# Patient Record
Sex: Female | Born: 2000 | Race: White | Hispanic: No | Marital: Single
Health system: Southern US, Community
[De-identification: ages and names within clinical notes are randomized; demographics above are authoritative.]

## PROBLEM LIST (undated history)

## (undated) DIAGNOSIS — T7840XA Allergy, unspecified, initial encounter: Secondary | ICD-10-CM

## (undated) HISTORY — DX: Allergy, unspecified, initial encounter: T78.40XA

---
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
## 2022-03-17 ENCOUNTER — Telehealth: Payer: Self-pay

## 2022-03-17 NOTE — Telephone Encounter (Signed)
Copied from [place] [phone]. Topic: General - Other ?>> Mar 16, 2022  4:12 PM McGill, Nelva Bush wrote: ?Reason for CRM: Pt mother called in and stated she wanted to make the office aware that she would be going into the room with pt for her upcoming appointment. Pt wants her mother in the room with her. ? ?Amy pt mother stated she received a call that pt had to go in by herself. ?

## 2022-03-19 ENCOUNTER — Encounter: Payer: Self-pay | Admitting: Family Medicine

## 2022-03-19 ENCOUNTER — Ambulatory Visit: Payer: Managed Care, Other (non HMO) | Admitting: Family Medicine

## 2022-03-19 VITALS — BP 129/86 | HR 66 | Temp 97.5°F | Resp 16 | Ht 64.0 in | Wt 329.3 lb

## 2022-03-19 DIAGNOSIS — L659 Nonscarring hair loss, unspecified: Secondary | ICD-10-CM | POA: Diagnosis not present

## 2022-03-19 DIAGNOSIS — R42 Dizziness and giddiness: Secondary | ICD-10-CM

## 2022-03-19 MED ORDER — SEMAGLUTIDE-WEIGHT MANAGEMENT 0.5 MG/0.5ML ~~LOC~~ SOAJ: 0.5000 mg | SUBCUTANEOUS | 0 refills | Status: DC

## 2022-03-19 NOTE — Assessment & Plan Note (Signed)
Acute concern > 6 months ?Will check TSH and vitamin ?If no clear cause, will defer to derm ?

## 2022-03-19 NOTE — Progress Notes (Signed)
? ?[employer] as a Neurosurgeon for Jacky Kindle, FNP.,have documented all relevant documentation on the behalf of Jacky Kindle, FNP,as directed by  Jacky Kindle, FNP while in the presence of Jacky Kindle, FNP.  ?New patient visit ? ? ?Patient: Jody Alvarado   DOB: 05/04/2001   20 y.o. Female  MRN: [id number] ?Visit Date: 03/19/2022 ? ?Today's healthcare provider: Jacky Kindle, FNP  ?Introduced to Publishing rights manager role and practice setting.  All questions answered.  Discussed provider/patient relationship and expectations. ? ? ?Chief Complaint  ?Patient presents with  ? New Patient (Initial Visit)  ? ?Subjective  ?  ?Jody Alvarado is a 21 y.o. female who presents today as a new patient to establish care.  ?HPI  ?Patient presents in office today accompanied by her mother as a new patient. Patient stats that she feels well today, she is a former patient of [employer]. Patient states that she would like to address today hair thinning over the past 6 months, weight management and she reports episodes of dizziness that has been intermittent for 90 days or less. Dizziness is triggered upon standing suddenly, patient denies associated symptoms of nausea, vomiting or visual changes . ? ?Past Medical History:  ?Diagnosis Date  ? Allergy   ? ?History reviewed. No pertinent surgical history. ?Family Status  ?Relation Name Status  ? Mother  (Not Specified)  ? Mat Uncle  (Not Specified)  ? MGM  (Not Specified)  ? ?Family History  ?Problem Relation Age of Onset  ? Depression Mother   ? Anxiety disorder Mother   ? Anxiety disorder Maternal Uncle   ? Thyroid disease Maternal Grandmother   ? ?Social History  ? ?Socioeconomic History  ? Marital status: Single  ?  Spouse name: Not on file  ? Number of children: Not on file  ? Years of education: Not on file  ? Highest education level: Not on file  ?Occupational History  ? Not on file  ?Tobacco Use  ? Smoking status: Not on file  ? Smokeless tobacco: Not on file   ?Substance and Sexual Activity  ? Alcohol use: Not on file  ? Drug use: Not on file  ? Sexual activity: Not on file  ?Other Topics Concern  ? Not on file  ?Social History Narrative  ? Not on file  ? ?Social Determinants of Health  ? ?Financial Resource Strain: Not on file  ?Food Insecurity: Not on file  ?Transportation Needs: Not on file  ?Physical Activity: Not on file  ?Stress: Not on file  ?Social Connections: Not on file  ? ?No outpatient medications prior to visit.  ? ?No facility-administered medications prior to visit.  ? ?Allergies  ?Allergen Reactions  ? Amoxicillin-Pot Clavulanate Rash  ? ? ? ?There is no immunization history on file for this patient. ? ?Health Maintenance  ?Topic Date Due  ? COVID-19 Vaccine (1) Never done  ? HPV VACCINES (1 - 2-dose series) Never done  ? HIV Screening  Never done  ? Hepatitis C Screening  Never done  ? INFLUENZA VACCINE  07/13/2022  ? ? ?Patient Care Team: ?Jacky Kindle, FNP as PCP - General (Family Medicine) ? ?Review of Systems  ?Constitutional:  Positive for unexpected weight change.  ?Skin:  Positive for rash.  ?Neurological:  Positive for dizziness and headaches.  ?Psychiatric/Behavioral:  The patient is nervous/anxious.   ?All other systems reviewed and are negative. ? ? ? ? Objective  ?  ?  BP 129/86   Pulse 66   Temp (!) 97.5 ?F (36.4 ?C) (Temporal)   Resp 16   Ht 5\' 4"  (1.626 m)   Wt (!) 329 lb 4.8 oz (149.4 kg)   LMP 03/15/2022 (Exact Date)   BMI 56.52 kg/m?  ? ? ?Physical Exam ?Vitals and nursing note reviewed.  ?Constitutional:   ?   General: She is not in acute distress. ?   Appearance: Normal appearance. She is obese. She is not ill-appearing, toxic-appearing or diaphoretic.  ?HENT:  ?   Head: Normocephalic and atraumatic.  ?Cardiovascular:  ?   Rate and Rhythm: Normal rate and regular rhythm.  ?   Pulses: Normal pulses.  ?   Heart sounds: Normal heart sounds. No murmur heard. ?  No friction rub. No gallop.  ?Pulmonary:  ?   Effort: Pulmonary effort  is normal. No respiratory distress.  ?   Breath sounds: Normal breath sounds. No stridor. No wheezing, rhonchi or rales.  ?Chest:  ?   Chest wall: No tenderness.  ?Abdominal:  ?   General: Bowel sounds are normal.  ?   Palpations: Abdomen is soft.  ?Musculoskeletal:     ?   General: No swelling, tenderness, deformity or signs of injury. Normal range of motion.  ?   Cervical back: Normal range of motion and neck supple. No tenderness.  ?   Right lower leg: No edema.  ?   Left lower leg: No edema.  ?Lymphadenopathy:  ?   Cervical: No cervical adenopathy.  ?Skin: ?   General: Skin is warm and dry.  ?   Capillary Refill: Capillary refill takes less than 2 seconds.  ?   Coloration: Skin is not jaundiced or pale.  ?   Findings: No bruising, erythema, lesion or rash.  ?Neurological:  ?   General: No focal deficit present.  ?   Mental Status: She is alert and oriented to person, place, and time. Mental status is at baseline.  ?   Cranial Nerves: No cranial nerve deficit.  ?   Sensory: No sensory deficit.  ?   Motor: No weakness.  ?   Coordination: Coordination normal.  ?Psychiatric:     ?   Mood and Affect: Mood normal.     ?   Behavior: Behavior normal.     ?   Thought Content: Thought content normal.     ?   Judgment: Judgment normal.  ? ? ?Depression Screen ? ?  03/19/2022  ?  1:29 PM  ?PHQ 2/9 Scores  ?PHQ - 2 Score 0  ?PHQ- 9 Score 3  ? ?No results found for any visits on 03/19/22. ? Assessment & Plan   ?  ? ?Problem List Items Addressed This Visit   ? ?  ? Other  ? Dizziness - Primary  ?  EKG done for dizziness ?No previous EKG ?Read of SR; no ectopy  ?No sign of LVH ?Will get CBC as well ?Normal heart sounds ? ?  ?  ? Relevant Orders  ? EKG 12-Lead (Completed)  ? CBC with Differential/Platelet  ? Hair loss  ?  Acute concern > 6 months ?Will check TSH and vitamin ?If no clear cause, will defer to derm ?  ?  ? Relevant Orders  ? Vitamin D (25 hydroxy)  ? B12 and Folate Panel  ? TSH + free T4  ? Morbid obesity (HCC)  ?   Chronic concern, worsening ?Gaining 70# in 2 years ?Will check CMP  and A1c today ?Will start wegovy; advised may need PA ?  ?  ? Relevant Medications  ? Semaglutide-Weight Management 0.5 MG/0.5ML SOAJ  ? Other Relevant Orders  ? Comprehensive Metabolic Panel (CMET)  ? Lipid panel  ? Hemoglobin A1c  ? ? ? ?Return in about 6 months (around 09/18/2022) for chonic disease management.  ?  ? ?I, Jacky Kindle, FNP, have reviewed all documentation for this visit. The documentation on 03/19/22 for the exam, diagnosis, procedures, and orders are all accurate and complete. ? ?Jacky Kindle, FNP  ?[facility] ?[phone] (phone) ?[phone] (fax) ? ?[facility] ?

## 2022-03-19 NOTE — Assessment & Plan Note (Signed)
EKG done for dizziness ?No previous EKG ?Read of SR; no ectopy  ?No sign of LVH ?Will get CBC as well ?Normal heart sounds ? ?

## 2022-03-19 NOTE — Assessment & Plan Note (Signed)
Chronic concern, worsening ?Gaining 70# in 2 years ?Will check CMP and A1c today ?Will start wegovy; advised may need PA ?

## 2022-03-20 LAB — CBC WITH DIFFERENTIAL/PLATELET
Basophils Absolute: 0 10*3/uL (ref 0.0–0.2)
Basos: 0 %
EOS (ABSOLUTE): 0 10*3/uL (ref 0.0–0.4)
Eos: 0 %
Hematocrit: 36.3 % (ref 34.0–46.6)
Hemoglobin: 12 g/dL (ref 11.1–15.9)
Immature Grans (Abs): 0 10*3/uL (ref 0.0–0.1)
Immature Granulocytes: 0 %
Lymphocytes Absolute: 2 10*3/uL (ref 0.7–3.1)
Lymphs: 29 %
MCH: 27 pg (ref 26.6–33.0)
MCHC: 33.1 g/dL (ref 31.5–35.7)
MCV: 82 fL (ref 79–97)
Monocytes Absolute: 0.3 10*3/uL (ref 0.1–0.9)
Monocytes: 5 %
Neutrophils Absolute: 4.4 10*3/uL (ref 1.4–7.0)
Neutrophils: 66 %
Platelets: 282 10*3/uL (ref 150–450)
RBC: 4.45 x10E6/uL (ref 3.77–5.28)
RDW: 13.9 % (ref 11.7–15.4)
WBC: 6.7 10*3/uL (ref 3.4–10.8)

## 2022-03-20 LAB — COMPREHENSIVE METABOLIC PANEL
ALT: 15 IU/L (ref 0–32)
AST: 16 IU/L (ref 0–40)
Albumin/Globulin Ratio: 1.4 (ref 1.2–2.2)
Albumin: 4 g/dL (ref 3.9–5.0)
Alkaline Phosphatase: 138 IU/L — ABNORMAL HIGH (ref 42–106)
BUN/Creatinine Ratio: 18 (ref 9–23)
BUN: 11 mg/dL (ref 6–20)
Bilirubin Total: 0.3 mg/dL (ref 0.0–1.2)
CO2: 23 mmol/L (ref 20–29)
Calcium: 9.2 mg/dL (ref 8.7–10.2)
Chloride: 103 mmol/L (ref 96–106)
Creatinine, Ser: 0.61 mg/dL (ref 0.57–1.00)
Globulin, Total: 2.9 g/dL (ref 1.5–4.5)
Glucose: 95 mg/dL (ref 70–99)
Potassium: 4.3 mmol/L (ref 3.5–5.2)
Sodium: 140 mmol/L (ref 134–144)
Total Protein: 6.9 g/dL (ref 6.0–8.5)
eGFR: 133 mL/min/{1.73_m2} (ref >59)

## 2022-03-20 LAB — HEMOGLOBIN A1C
Est. average glucose Bld gHb Est-mCnc: 91 mg/dL
Hgb A1c MFr Bld: 4.8 % (ref 4.8–5.6)

## 2022-03-20 LAB — LIPID PANEL
Chol/HDL Ratio: 3.5 ratio (ref 0.0–4.4)
Cholesterol, Total: 180 mg/dL — ABNORMAL HIGH (ref 100–169)
HDL: 51 mg/dL (ref >39)
LDL Chol Calc (NIH): 114 mg/dL — ABNORMAL HIGH (ref 0–109)
Triglycerides: 80 mg/dL (ref 0–89)
VLDL Cholesterol Cal: 15 mg/dL (ref 5–40)

## 2022-03-20 LAB — TSH+FREE T4
Free T4: 0.97 ng/dL (ref 0.93–1.60)
TSH: 3.66 u[IU]/mL (ref 0.450–4.500)

## 2022-03-20 LAB — B12 AND FOLATE PANEL
Folate: 7.8 ng/mL (ref >3.0)
Vitamin B-12: 536 pg/mL (ref 232–1245)

## 2022-03-20 LAB — VITAMIN D 25 HYDROXY (VIT D DEFICIENCY, FRACTURES): Vit D, 25-Hydroxy: 20.2 ng/mL — ABNORMAL LOW (ref 30.0–100.0)

## 2022-03-21 ENCOUNTER — Other Ambulatory Visit: Payer: Self-pay | Admitting: Family Medicine

## 2022-03-21 MED ORDER — VITAMIN D (ERGOCALCIFEROL) 1.25 MG (50000 UNIT) PO CAPS: 50000.0000 [IU] | ORAL_CAPSULE | ORAL | 1 refills | Status: DC

## 2022-03-25 ENCOUNTER — Telehealth: Payer: Self-pay | Admitting: Family Medicine

## 2022-03-25 NOTE — Telephone Encounter (Signed)
Pre-authorization in progress. KW 

## 2022-03-25 NOTE — Telephone Encounter (Signed)
Pt following up on need for a prior auth on her ?Semaglutide-Weight Management 0.5 MG/0.5ML SOAJ ? ?[phone] ? ?Pt states her insurance told her to mark it as urgent. ?

## 2022-04-06 NOTE — Telephone Encounter (Signed)
Patient mom Jody Alvarado called in to inquire what is the next step with medication since Wagovy was not approved please call Jody Alvarado at Ph# [phone] ?

## 2022-04-08 NOTE — Telephone Encounter (Signed)
Patient's mother reports medication has been approved.  ?

## 2022-04-21 ENCOUNTER — Other Ambulatory Visit: Payer: Self-pay | Admitting: Family Medicine

## 2022-04-22 ENCOUNTER — Encounter: Payer: Self-pay | Admitting: Family Medicine

## 2022-04-26 ENCOUNTER — Other Ambulatory Visit: Payer: Self-pay | Admitting: Family Medicine

## 2022-04-28 ENCOUNTER — Other Ambulatory Visit: Payer: Self-pay | Admitting: Family Medicine

## 2022-04-28 MED ORDER — ONDANSETRON HCL 4 MG PO TABS: 4.0000 mg | ORAL_TABLET | Freq: Three times a day (TID) | ORAL | 3 refills | Status: DC | PRN

## 2022-04-29 ENCOUNTER — Encounter: Payer: Self-pay | Admitting: Obstetrics

## 2022-05-24 ENCOUNTER — Other Ambulatory Visit: Payer: Self-pay | Admitting: Family Medicine

## 2022-05-24 MED ORDER — SEMAGLUTIDE-WEIGHT MANAGEMENT 1.7 MG/0.75ML ~~LOC~~ SOAJ: 1.7000 mg | SUBCUTANEOUS | 0 refills | Status: DC

## 2022-05-24 MED ORDER — SEMAGLUTIDE-WEIGHT MANAGEMENT 2.4 MG/0.75ML ~~LOC~~ SOAJ: 2.4000 mg | SUBCUTANEOUS | 3 refills | Status: DC

## 2022-05-24 MED ORDER — SEMAGLUTIDE-WEIGHT MANAGEMENT 1 MG/0.5ML ~~LOC~~ SOAJ: 1.0000 mg | SUBCUTANEOUS | 0 refills | Status: DC

## 2022-06-21 ENCOUNTER — Encounter: Payer: Self-pay | Admitting: Family Medicine

## 2022-06-21 DIAGNOSIS — Z111 Encounter for screening for respiratory tuberculosis: Secondary | ICD-10-CM

## 2022-06-26 LAB — QUANTIFERON-TB GOLD PLUS
QuantiFERON Mitogen Value: >10 IU/mL
QuantiFERON Nil Value: 0 IU/mL
QuantiFERON TB1 Ag Value: 0.02 IU/mL
QuantiFERON TB2 Ag Value: 0.01 IU/mL
QuantiFERON-TB Gold Plus: NEGATIVE

## 2022-06-28 ENCOUNTER — Telehealth: Payer: Self-pay | Admitting: Family Medicine

## 2022-06-28 NOTE — Telephone Encounter (Signed)
Called patient to let them know forms are ready for pickup at the front desk. Okay for [facility] to advise.

## 2022-06-28 NOTE — Telephone Encounter (Signed)
Pt is calling is calling to see if her forms are ready. Please advise

## 2022-06-28 NOTE — Telephone Encounter (Signed)
Pt expressed understanding

## 2022-06-29 NOTE — Progress Notes (Signed)
Negative TB; form should be complete and ready for pick up later this week.   Jacky Kindle, FNP  [facility] [address] [place], [place] [address] [phone] (phone) [phone] (fax) [facility]

## 2022-09-18 ENCOUNTER — Other Ambulatory Visit: Payer: Self-pay | Admitting: Family Medicine

## 2022-11-25 ENCOUNTER — Ambulatory Visit (INDEPENDENT_AMBULATORY_CARE_PROVIDER_SITE_OTHER): Payer: BC Managed Care – PPO | Admitting: Family Medicine

## 2022-11-25 ENCOUNTER — Encounter: Payer: Self-pay | Admitting: Family Medicine

## 2022-11-25 VITALS — BP 125/72 | HR 71 | Resp 16 | Ht 64.0 in | Wt 311.0 lb

## 2022-11-25 DIAGNOSIS — R300 Dysuria: Secondary | ICD-10-CM

## 2022-11-25 LAB — POCT URINALYSIS DIPSTICK
Bilirubin, UA: NEGATIVE
Blood, UA: NEGATIVE
Glucose, UA: NEGATIVE
Ketones, UA: NEGATIVE
Leukocytes, UA: NEGATIVE
Nitrite, UA: NEGATIVE
Protein, UA: NEGATIVE
Spec Grav, UA: <=1.005 — AB (ref 1.010–1.025)
Urobilinogen, UA: 0.2 E.U./dL
pH, UA: 6 (ref 5.0–8.0)

## 2022-11-25 NOTE — Assessment & Plan Note (Signed)
2 days of symptoms No OTC products tried UA clean; will send for Ucx Pt denies concern for STIs No CVA or bladder tenderness RTC PRN

## 2022-11-25 NOTE — Patient Instructions (Signed)
AZO over the counter to assist Culture will return in 3 days- will call in Antibiotic as needed

## 2022-11-25 NOTE — Assessment & Plan Note (Signed)
Chronic, stable Denies further assistance at this time Body mass index is 53.38 kg/m.

## 2022-11-25 NOTE — Progress Notes (Signed)
Established patient visit   Patient: Jody Alvarado   DOB: 2001/04/14   21 y.o. Female  MRN: [id number] Visit Date: 11/25/2022  Today's healthcare provider: Jacky Kindle, FNP  Re Introduced to nurse practitioner role and practice setting.  All questions answered.  Discussed provider/patient relationship and expectations.  I,Tiffany J Bragg,acting as a scribe for Jacky Kindle, FNP.,have documented all relevant documentation on the behalf of Jacky Kindle, FNP,as directed by  Jacky Kindle, FNP while in the presence of Jacky Kindle, FNP.   Chief Complaint  Patient presents with   Dysuria    Patient complains of burning with urination for 2 days.    Subjective    HPI HPI     Dysuria    Additional comments: Patient complains of burning with urination for 2 days.       Last edited by Marlana Salvage, CMA on 11/25/2022  3:44 PM.      Medications: Outpatient Medications Prior to Visit  Medication Sig   ondansetron (ZOFRAN) 4 MG tablet Take 1 tablet (4 mg total) by mouth every 8 (eight) hours as needed for nausea or vomiting.   [DISCONTINUED] Semaglutide-Weight Management 1 MG/0.5ML SOAJ Inject 1 mg into the skin once a week.   [DISCONTINUED] Semaglutide-Weight Management 1.7 MG/0.75ML SOAJ Inject 1.7 mg into the skin once a week.   [DISCONTINUED] Semaglutide-Weight Management 2.4 MG/0.75ML SOAJ Inject 2.4 mg into the skin once a week.   [DISCONTINUED] Vitamin D, Ergocalciferol, (DRISDOL) 1.25 MG (50000 UNIT) CAPS capsule Take 1 capsule (50,000 Units total) by mouth every 7 (seven) days.   No facility-administered medications prior to visit.    Review of Systems    Objective    BP 125/72 (BP Location: Right Arm, Patient Position: Sitting, Cuff Size: Large)   Pulse 71   Resp 16   Ht 5\' 4"  (1.626 m)   Wt (!) 311 lb (141.1 kg)   SpO2 100%   BMI 53.38 kg/m   Physical Exam Vitals and nursing note reviewed.  Constitutional:      General: She is not in acute  distress.    Appearance: Normal appearance. She is obese. She is not ill-appearing, toxic-appearing or diaphoretic.  HENT:     Head: Normocephalic and atraumatic.  Cardiovascular:     Rate and Rhythm: Normal rate and regular rhythm.     Pulses: Normal pulses.     Heart sounds: Normal heart sounds. No murmur heard.    No friction rub. No gallop.  Pulmonary:     Effort: Pulmonary effort is normal. No respiratory distress.     Breath sounds: Normal breath sounds. No stridor. No wheezing, rhonchi or rales.  Chest:     Chest wall: No tenderness.  Abdominal:     Palpations: Abdomen is soft.     Tenderness: There is no abdominal tenderness. There is no right CVA tenderness or left CVA tenderness.  Musculoskeletal:        General: No swelling, tenderness, deformity or signs of injury. Normal range of motion.     Right lower leg: No edema.     Left lower leg: No edema.  Skin:    General: Skin is warm and dry.     Capillary Refill: Capillary refill takes less than 2 seconds.     Coloration: Skin is not jaundiced or pale.     Findings: No bruising, erythema, lesion or rash.  Neurological:     General: No  focal deficit present.     Mental Status: She is alert and oriented to person, place, and time. Mental status is at baseline.     Cranial Nerves: No cranial nerve deficit.     Sensory: No sensory deficit.     Motor: No weakness.     Coordination: Coordination normal.  Psychiatric:        Mood and Affect: Mood normal.        Behavior: Behavior normal.        Thought Content: Thought content normal.        Judgment: Judgment normal.     Results for orders placed or performed in visit on 11/25/22  POCT Urinalysis Dipstick  Result Value Ref Range   Color, UA yellow    Clarity, UA clear    Glucose, UA Negative Negative   Bilirubin, UA negative    Ketones, UA negative    Spec Grav, UA <=1.005 (A) 1.010 - 1.025   Blood, UA negative    pH, UA 6.0 5.0 - 8.0   Protein, UA Negative  Negative   Urobilinogen, UA 0.2 0.2 or 1.0 E.U./dL   Nitrite, UA negative    Leukocytes, UA Negative Negative   Appearance     Odor      Assessment & Plan     Problem List Items Addressed This Visit       Other   Dysuria - Primary    2 days of symptoms No OTC products tried UA clean; will send for Ucx Pt denies concern for STIs No CVA or bladder tenderness RTC PRN      Relevant Orders   POCT Urinalysis Dipstick (Completed)   Urine Culture   Morbid obesity (HCC)    Chronic, stable Denies further assistance at this time Body mass index is 53.38 kg/m.       Return if symptoms worsen or fail to improve.     Leilani Merl, FNP, have reviewed all documentation for this visit. The documentation on 11/25/22 for the exam, diagnosis, procedures, and orders are all accurate and complete.  Jacky Kindle, FNP  [facility] [phone] (phone) [phone] (fax)  [facility]

## 2022-11-29 LAB — URINE CULTURE

## 2022-11-30 NOTE — Progress Notes (Signed)
Negative UTI- no single bacteria identified on urine culture

## 2022-12-10 DIAGNOSIS — H9202 Otalgia, left ear: Secondary | ICD-10-CM | POA: Diagnosis not present

## 2022-12-10 DIAGNOSIS — A499 Bacterial infection, unspecified: Secondary | ICD-10-CM | POA: Diagnosis not present

## 2022-12-14 ENCOUNTER — Encounter: Payer: Self-pay | Admitting: Family Medicine

## 2022-12-14 ENCOUNTER — Ambulatory Visit (INDEPENDENT_AMBULATORY_CARE_PROVIDER_SITE_OTHER): Payer: BC Managed Care – PPO | Admitting: Family Medicine

## 2022-12-14 VITALS — BP 147/65 | HR 76 | Temp 98.1°F | Wt 306.9 lb

## 2022-12-14 DIAGNOSIS — H66002 Acute suppurative otitis media without spontaneous rupture of ear drum, left ear: Secondary | ICD-10-CM | POA: Insufficient documentation

## 2022-12-14 DIAGNOSIS — I10 Essential (primary) hypertension: Secondary | ICD-10-CM | POA: Insufficient documentation

## 2022-12-14 DIAGNOSIS — G43011 Migraine without aura, intractable, with status migrainosus: Secondary | ICD-10-CM | POA: Diagnosis not present

## 2022-12-14 MED ORDER — FLUTICASONE PROPIONATE 50 MCG/ACT NA SUSP: 2.0000 | Freq: Every day | NASAL | 6 refills | Status: DC

## 2022-12-14 MED ORDER — CEFDINIR 300 MG PO CAPS: 300.0000 mg | ORAL_CAPSULE | Freq: Two times a day (BID) | ORAL | 0 refills | Status: DC

## 2022-12-14 MED ORDER — NURTEC 75 MG PO TBDP: 1.0000 | ORAL_TABLET | ORAL | 0 refills | Status: DC | PRN

## 2022-12-14 NOTE — Progress Notes (Signed)
I,Connie R Striblin,acting as a Education administrator for Gwyneth Sprout, FNP.,have documented all relevant documentation on the behalf of Gwyneth Sprout, FNP,as directed by  Gwyneth Sprout, FNP while in the presence of Gwyneth Sprout, FNP.  Established patient visit  Patient: Jody Alvarado   DOB: 09-16-2001   22 y.o. Female  MRN: [id number] Visit Date: 12/14/2022  Today's healthcare provider: Gwyneth Sprout, FNP  Re Introduced to nurse practitioner role and practice setting.  All questions answered.  Discussed provider/patient relationship and expectations.  Subjective    HPI  Headache: Pt complains of neck pain, ear pain and headaches  Patient states that she has sore throat,ear pain, congestion and cough  2 weeks ago  Virtual appointment 12/29 was prescribed z pack which improved her condition    Headaches started Wednesday they are  inconsistent with light sensitivity   Pt denies dizziness   OTC pain meds not helping (advil)  Pt complains of nausea and diahrea which started today   Medications: Outpatient Medications Prior to Visit  Medication Sig   ondansetron (ZOFRAN) 4 MG tablet Take 1 tablet (4 mg total) by mouth every 8 (eight) hours as needed for nausea or vomiting. (Patient not taking: Reported on 12/14/2022)   No facility-administered medications prior to visit.   Review of Systems    Objective    BP (!) 147/65 (BP Location: Left Arm, Patient Position: Sitting, Cuff Size: Large)   Pulse 76   Temp 98.1 F (36.7 C) (Oral)   Wt (!) 306 lb 14.4 oz (139.2 kg)   SpO2 100%   BMI 52.68 kg/m   Physical Exam Vitals and nursing note reviewed.  Constitutional:      General: She is not in acute distress.    Appearance: Normal appearance. She is well-developed. She is obese. She is not ill-appearing, toxic-appearing or diaphoretic.  HENT:     Head: Normocephalic and atraumatic.     Right Ear: Tympanic membrane, ear canal and external ear normal.     Left Ear: External ear normal.  Swelling and tenderness present. Tympanic membrane is erythematous.     Nose: Nose normal.     Mouth/Throat:     Mouth: Mucous membranes are moist.     Pharynx: Oropharynx is clear.  Eyes:     Extraocular Movements: Extraocular movements intact.  Neck:     Comments: Lymphadenopathy behind L ear in setting of acute infection Cardiovascular:     Rate and Rhythm: Normal rate and regular rhythm.     Pulses: Normal pulses.     Heart sounds: Normal heart sounds. No murmur heard.    No friction rub. No gallop.  Pulmonary:     Effort: Pulmonary effort is normal. No respiratory distress.     Breath sounds: Normal breath sounds. No stridor. No wheezing, rhonchi or rales.  Chest:     Chest wall: No tenderness.  Abdominal:     General: Bowel sounds are normal.     Palpations: Abdomen is soft.     Comments: Continue to recommend bland meals and adequate hydration to assist   Musculoskeletal:        General: No swelling, tenderness, deformity or signs of injury. Normal range of motion.     Right lower leg: No edema.     Left lower leg: No edema.  Skin:    General: Skin is warm and dry.     Capillary Refill: Capillary refill takes less than 2 seconds.  Coloration: Skin is not jaundiced or pale.     Findings: No bruising, erythema, lesion or rash.  Neurological:     General: No focal deficit present.     Mental Status: She is alert and oriented to person, place, and time. Mental status is at baseline.     Cranial Nerves: No cranial nerve deficit.     Sensory: No sensory deficit.     Motor: No weakness.     Coordination: Coordination normal.  Psychiatric:        Mood and Affect: Mood normal.        Speech: Speech normal.        Behavior: Behavior normal.        Thought Content: Thought content normal.        Judgment: Judgment normal.     No results found for any visits on 12/14/22.  Assessment & Plan     Problem List Items Addressed This Visit       Cardiovascular and  Mediastinum   Elevated blood pressure reading in office with diagnosis of hypertension    Likely in setting of acute illness and poor PO intake Continue to monitor  Goal <140/<90      Intractable migraine without aura and with status migrainosus    Acute, in setting of recent illness Trial of nurtec to assist Continue to recommend hydration, sleep, caffeine and stress monitoring Return as needed      Relevant Medications   Rimegepant Sulfate (NURTEC) 75 MG TBDP     Nervous and Auditory   Non-recurrent acute suppurative otitis media of left ear without spontaneous rupture of tympanic membrane - Primary    Acute, stable Was treated with Zpak for URI; however, pain has settled into L ear and has not improved Recommend supportive care with new Abx today RTC as needed Work note provided online      Relevant Medications   cefdinir (OMNICEF) 300 MG capsule   fluticasone (FLONASE) 50 MCG/ACT nasal spray     Other   Morbid obesity ([place])    Chronic, stable Failed wegovy previously in setting of gallbladder concerns Body mass index is 52.68 kg/m.       Return if symptoms worsen or fail to improve.     Vonna Kotyk, FNP, have reviewed all documentation for this visit. The documentation on 12/14/22 for the exam, diagnosis, procedures, and orders are all accurate and complete.  Gwyneth Sprout, [place] [phone] (phone) [phone] (fax)  [place]

## 2022-12-14 NOTE — Assessment & Plan Note (Signed)
Acute, in setting of recent illness Trial of nurtec to assist Continue to recommend hydration, sleep, caffeine and stress monitoring Return as needed

## 2022-12-14 NOTE — Assessment & Plan Note (Signed)
Acute, stable Was treated with Zpak for URI; however, pain has settled into L ear and has not improved Recommend supportive care with new Abx today RTC as needed Work note provided online

## 2022-12-14 NOTE — Assessment & Plan Note (Signed)
Likely in setting of acute illness and poor PO intake Continue to monitor  Goal <140/<90

## 2022-12-14 NOTE — Assessment & Plan Note (Signed)
Chronic, stable Failed wegovy previously in setting of gallbladder concerns Body mass index is 52.68 kg/m.

## 2023-05-16 ENCOUNTER — Telehealth: Payer: Self-pay

## 2023-05-16 NOTE — Telephone Encounter (Signed)
LVM. Also sending mychart msg. AS, CMA

## 2023-06-03 ENCOUNTER — Ambulatory Visit (INDEPENDENT_AMBULATORY_CARE_PROVIDER_SITE_OTHER): Payer: BC Managed Care – PPO | Admitting: Family Medicine

## 2023-06-03 ENCOUNTER — Encounter: Payer: Self-pay | Admitting: Family Medicine

## 2023-06-03 VITALS — BP 127/72 | HR 74 | Ht 64.0 in | Wt 324.8 lb

## 2023-06-03 DIAGNOSIS — Z0000 Encounter for general adult medical examination without abnormal findings: Secondary | ICD-10-CM

## 2023-06-03 NOTE — Assessment & Plan Note (Signed)
Chronic, worsening Body mass index is 55.75 kg/m. Discussed importance of healthy weight management Discussed diet and exercise

## 2023-06-03 NOTE — Progress Notes (Signed)
I,Sha'taria Tyson,acting as a Neurosurgeon for Jacky Kindle, FNP.,have documented all relevant documentation on the behalf of Jacky Kindle, FNP,as directed by  Jacky Kindle, FNP while in the presence of Jacky Kindle, FNP.   Complete physical exam   Patient: Jody Alvarado   DOB: [birth year]   22 y.o. Female  MRN: [id number] Visit Date: 06/03/2023  Today's healthcare provider: Jacky Kindle, FNP  Introduced to nurse practitioner role and practice setting.  All questions answered.  Discussed provider/patient relationship and expectations.  Pt declines vaccines/labs at this time; will reach back out to PCP as needed.  Subjective    Jody Alvarado is a 22 y.o. female who presents today for a complete physical exam.  She reports consuming a general diet. The patient does not participate in regular exercise at present. She generally feels well. She reports sleeping well. She does not have additional problems to discuss today.   HPI  -HPV Vaccine: Declined -HIV Screen and Hepatitis C Screening: Declined  Past Medical History:  Diagnosis Date   Allergy    History reviewed. No pertinent surgical history. Social History   Socioeconomic History   Marital status: Single    Spouse name: Not on file   Number of children: Not on file   Years of education: Not on file   Highest education level: Not on file  Occupational History   Not on file  Tobacco Use   Smoking status: Unknown   Smokeless tobacco: Not on file  Vaping Use   Vaping Use: Not on file  Substance and Sexual Activity   Alcohol use: Not Currently   Drug use: Not Currently   Sexual activity: Not Currently  Other Topics Concern   Not on file  Social History Narrative   Not on file   Social Determinants of Health   Financial Resource Strain: Not on file  Food Insecurity: Not on file  Transportation Needs: Not on file  Physical Activity: Not on file  Stress: Not on file  Social Connections: Not on file  Intimate  Partner Violence: Not on file   Family Status  Relation Name Status   Mother  (Not Specified)   Mat Uncle  (Not Specified)   MGM  (Not Specified)   Family History  Problem Relation Age of Onset   Depression Mother    Anxiety disorder Mother    Anxiety disorder Maternal Uncle    Thyroid disease Maternal Grandmother    Allergies  Allergen Reactions   Amoxicillin-Pot Clavulanate Rash    Patient Care Team: Jacky Kindle, FNP as PCP - General (Family Medicine)   Medications: Outpatient Medications Prior to Visit  Medication Sig   [DISCONTINUED] cefdinir (OMNICEF) 300 MG capsule Take 1 capsule (300 mg total) by mouth 2 (two) times daily. (Patient not taking: Reported on 06/03/2023)   [DISCONTINUED] fluticasone (FLONASE) 50 MCG/ACT nasal spray Place 2 sprays into both nostrils daily. (Patient not taking: Reported on 06/03/2023)   [DISCONTINUED] ondansetron (ZOFRAN) 4 MG tablet Take 1 tablet (4 mg total) by mouth every 8 (eight) hours as needed for nausea or vomiting. (Patient not taking: Reported on 12/14/2022)   [DISCONTINUED] Rimegepant Sulfate (NURTEC) 75 MG TBDP Take 1 tablet by mouth as needed. (Patient not taking: Reported on 06/03/2023)   No facility-administered medications prior to visit.    Review of Systems    Objective    BP 127/72 (BP Location: Right Arm, Patient Position: Sitting, Cuff Size: Large)  Pulse 74   Ht 5\' 4"  (1.626 m)   Wt (!) 324 lb 12.8 oz (147.3 kg)   LMP 05/16/2023 (Within Days)   SpO2 99%   BMI 55.75 kg/m     Physical Exam Vitals and nursing note reviewed.  Constitutional:      General: She is awake. She is not in acute distress.    Appearance: Normal appearance. She is well-developed and well-groomed. She is obese. She is not ill-appearing, toxic-appearing or diaphoretic.  HENT:     Head: Normocephalic and atraumatic.     Jaw: There is normal jaw occlusion. No trismus, tenderness, swelling or pain on movement.     Right Ear: Hearing,  tympanic membrane, ear canal and external ear normal. There is no impacted cerumen.     Left Ear: Hearing, tympanic membrane, ear canal and external ear normal. There is no impacted cerumen.     Nose: Nose normal. No congestion or rhinorrhea.     Right Turbinates: Not enlarged, swollen or pale.     Left Turbinates: Not enlarged, swollen or pale.     Right Sinus: No maxillary sinus tenderness or frontal sinus tenderness.     Left Sinus: No maxillary sinus tenderness or frontal sinus tenderness.     Mouth/Throat:     Lips: Pink.     Mouth: Mucous membranes are moist. No injury.     Tongue: No lesions.     Pharynx: Oropharynx is clear. Uvula midline. No pharyngeal swelling, oropharyngeal exudate, posterior oropharyngeal erythema or uvula swelling.     Tonsils: No tonsillar exudate or tonsillar abscesses.     Comments: Tongue ring noted Eyes:     General: Lids are normal. Lids are everted, no foreign bodies appreciated. Vision grossly intact. Gaze aligned appropriately. No allergic shiner or visual field deficit.       Right eye: No discharge.        Left eye: No discharge.     Extraocular Movements: Extraocular movements intact.     Conjunctiva/sclera: Conjunctivae normal.     Right eye: Right conjunctiva is not injected. No exudate.    Left eye: Left conjunctiva is not injected. No exudate.    Pupils: Pupils are equal, round, and reactive to light.  Neck:     Thyroid: No thyroid mass, thyromegaly or thyroid tenderness.     Vascular: No carotid bruit.     Trachea: Trachea normal.  Cardiovascular:     Rate and Rhythm: Normal rate and regular rhythm.     Pulses: Normal pulses.          Carotid pulses are 2+ on the right side and 2+ on the left side.      Radial pulses are 2+ on the right side and 2+ on the left side.       Dorsalis pedis pulses are 2+ on the right side and 2+ on the left side.       Posterior tibial pulses are 2+ on the right side and 2+ on the left side.     Heart  sounds: Normal heart sounds, S1 normal and S2 normal. No murmur heard.    No friction rub. No gallop.  Pulmonary:     Effort: Pulmonary effort is normal. No respiratory distress.     Breath sounds: Normal breath sounds and air entry. No stridor. No wheezing, rhonchi or rales.  Chest:     Chest wall: No tenderness.  Abdominal:     General: Abdomen is flat. Bowel sounds  are normal. There is no distension.     Palpations: Abdomen is soft. There is no mass.     Tenderness: There is no abdominal tenderness. There is no right CVA tenderness, left CVA tenderness, guarding or rebound.     Hernia: No hernia is present.  Genitourinary:    Comments: Exam deferred; denies complaints Musculoskeletal:        General: No swelling, tenderness, deformity or signs of injury. Normal range of motion.     Cervical back: Full passive range of motion without pain, normal range of motion and neck supple. No edema, rigidity or tenderness. No muscular tenderness.     Right lower leg: No edema.     Left lower leg: No edema.  Lymphadenopathy:     Cervical: No cervical adenopathy.     Right cervical: No superficial, deep or posterior cervical adenopathy.    Left cervical: No superficial, deep or posterior cervical adenopathy.  Skin:    General: Skin is warm and dry.     Capillary Refill: Capillary refill takes less than 2 seconds.     Coloration: Skin is not jaundiced or pale.     Findings: No bruising, erythema, lesion or rash.  Neurological:     General: No focal deficit present.     Mental Status: She is alert and oriented to person, place, and time. Mental status is at baseline.     GCS: GCS eye subscore is 4. GCS verbal subscore is 5. GCS motor subscore is 6.     Sensory: Sensation is intact. No sensory deficit.     Motor: Motor function is intact. No weakness.     Coordination: Coordination is intact. Coordination normal.     Gait: Gait is intact. Gait normal.  Psychiatric:        Attention and  Perception: Attention and perception normal.        Mood and Affect: Mood and affect normal.        Speech: Speech normal.        Behavior: Behavior normal. Behavior is cooperative.        Thought Content: Thought content normal.        Cognition and Memory: Cognition and memory normal.        Judgment: Judgment normal.     Last depression screening scores    06/03/2023    2:39 PM 12/14/2022    4:16 PM 11/25/2022    3:45 PM  PHQ 2/9 Scores  PHQ - 2 Score 0 0 0  PHQ- 9 Score 0 0 0   Last fall risk screening    06/03/2023    2:39 PM  Fall Risk   Falls in the past year? 0  Injury with Fall? 0  Risk for fall due to : No Fall Risks  Follow up Falls evaluation completed   Last Audit-C alcohol use screening    06/03/2023    2:39 PM  Alcohol Use Disorder Test (AUDIT)  1. How often do you have a drink containing alcohol? 0  3. How often do you have six or more drinks on one occasion? 0   A score of 3 or more in women, and 4 or more in men indicates increased risk for alcohol abuse, EXCEPT if all of the points are from question 1   No results found for any visits on 06/03/23.  Assessment & Plan    Routine Health Maintenance and Physical Exam  Exercise Activities and Dietary recommendations  Goals  None      There is no immunization history on file for this patient.  Health Maintenance  Topic Date Due   COVID-19 Vaccine (1) Never done   HPV VACCINES (1 - 2-dose series) Never done   HIV Screening  Never done   Hepatitis C Screening  Never done   DTaP/Tdap/Td (1 - Tdap) Never done   INFLUENZA VACCINE  07/14/2023    Discussed health benefits of physical activity, and encouraged her to engage in regular exercise appropriate for her age and condition.  Problem List Items Addressed This Visit       Other   Annual physical exam - Primary    Things to do to keep yourself healthy  - Exercise at least 30-45 minutes a day, 3-4 days a week.  - Eat a low-fat diet with lots  of fruits and vegetables, up to 7-9 servings per day.  - Seatbelts can save your life. Wear them always.  - Smoke detectors on every level of your home, check batteries every year.  - Eye Doctor - have an eye exam every 1-2 years  - Safe sex - if you may be exposed to STDs, use a condom.  - Alcohol -  If you drink, do it moderately, less than 2 drinks per day.  - Health Care Power of Attorney. Choose someone to speak for you if you are not able.  - Depression is common in our stressful world.If you're feeling down or losing interest in things you normally enjoy, please come in for a visit.  - Violence - If anyone is threatening or hurting you, please call immediately. Start multi-vitamin if desired Start 2000 IU Vit D3 daily if desired       Morbid obesity (HCC)    Chronic, worsening Body mass index is 55.75 kg/m. Discussed importance of healthy weight management Discussed diet and exercise       Return in about 1 year (around 06/02/2024) for annual examination.    Leilani Merl, FNP, have reviewed all documentation for this visit. The documentation on 06/03/23 for the exam, diagnosis, procedures, and orders are all accurate and complete.  Jacky Kindle, FNP  [facility] [phone] (phone) [phone] (fax)  [facility]

## 2023-06-03 NOTE — Assessment & Plan Note (Signed)

## 2023-06-27 ENCOUNTER — Ambulatory Visit
Admission: RE | Admit: 2023-06-27 | Discharge: 2023-06-27 | Disposition: A | Discharge status: Home / Self Care | Payer: BC Managed Care – PPO | Attending: Family Medicine | Admitting: Family Medicine

## 2023-06-27 ENCOUNTER — Ambulatory Visit: Admission: RE | Admit: 2023-06-27 | Payer: BC Managed Care – PPO | Source: Ambulatory Visit

## 2023-06-27 ENCOUNTER — Encounter: Payer: Self-pay | Admitting: Family Medicine

## 2023-06-27 ENCOUNTER — Ambulatory Visit: Payer: BC Managed Care – PPO | Admitting: Family Medicine

## 2023-06-27 VITALS — BP 138/80 | HR 64 | Temp 98.0°F | Resp 12 | Ht 64.0 in | Wt 323.0 lb

## 2023-06-27 DIAGNOSIS — M25532 Pain in left wrist: Secondary | ICD-10-CM

## 2023-06-27 MED ORDER — NAPROXEN 500 MG PO TABS: 500.0000 mg | ORAL_TABLET | Freq: Two times a day (BID) | ORAL | 0 refills | Status: DC

## 2023-06-27 NOTE — Progress Notes (Signed)
      Established patient visit   Patient: Jody Alvarado   DOB: [birth year]   21 y.o. Female  MRN: [id number] Visit Date: 06/27/2023  Today's healthcare provider: Mila Merry, MD   Chief Complaint  Patient presents with   Wrist Pain    Patient C/O left wrist pain and swelling x a couple of weeks. She has been using a wrist brace and OTC cold and hot roll on. She reports brace and OTC treatment helped. Pain and swelling got worse again this morning. She denies any falls or injuries. 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    Subjective    HPI  Discussed the use of AI scribe software for clinical note transcription with the patient, who gave verbal consent to proceed.  History of Present Illness   The patient, who works in childcare, presented with left wrist swelling that started acutely this morning. She reports a history of intermittent wrist pain for a couple of weeks, which she attributed to lifting babies at work. The pain was initially managed with over-the-counter remedies, including a wrist brace and topical ointment, which provided temporary relief. However, the pain recurred on Friday night and has persisted since. The patient noted that the wrist was swollen upon waking up this morning, a new symptom that was not present during the initial episodes of pain. She denied any known injury to the wrist. The pain is localized to the proximal hand wrist and radiates upwards, worsening with movement. Over-the-counter ibuprofen provided no relief. The patient has been using a wrist brace and applying ice packs for symptom management.       Medications: No outpatient medications prior to visit.   No facility-administered medications prior to visit.     Objective    BP 138/80 (BP Location: Left Arm, Patient Position: Sitting, Cuff Size: Large)   Pulse 64   Temp 98 F (36.7 C) (Temporal)   Resp 12   Ht 5\' 4"  (1.626 m)   Wt (!) 323 lb (146.5  kg)   LMP 06/15/2023 (Within Days)   SpO2 99%   BMI 55.44 kg/m    Physical Exam   MUSCULOSKELETAL: Tenderness upon palpation around the wrist and top of the hand. Pain with wrist movement. Swelling in the left hand. FROM of wrist. No erythema.       Assessment & Plan     Assessment and Plan    Left Wrist Pain: Acute onset of pain and swelling in the left wrist, likely due to repetitive strain from lifting infants at work. No known injury. Pain is localized to the wrist and exacerbated by movement. Over-the-counter ibuprofen and wrist brace provided temporary relief but symptoms have persisted. -Order wrist x-ray to rule out bone injury or disease. -Prescribe Naprosyn for pain relief. -Advise continued use of wrist brace and application of ice pack three times daily. -Possible referral to orthopedist for more rigid brace pending x-ray results.          Mila Merry, MD  [facility] [phone] (phone) [phone] (fax)  [facility]

## 2023-07-03 ENCOUNTER — Other Ambulatory Visit: Payer: Self-pay | Admitting: Family Medicine

## 2023-07-03 DIAGNOSIS — M25532 Pain in left wrist: Secondary | ICD-10-CM

## 2023-07-05 DIAGNOSIS — M25532 Pain in left wrist: Secondary | ICD-10-CM | POA: Diagnosis not present

## 2023-07-09 ENCOUNTER — Other Ambulatory Visit: Payer: Self-pay | Admitting: Family Medicine

## 2023-07-11 DIAGNOSIS — M25532 Pain in left wrist: Secondary | ICD-10-CM | POA: Diagnosis not present

## 2023-07-26 ENCOUNTER — Other Ambulatory Visit: Payer: Self-pay | Admitting: Family Medicine

## 2023-08-02 DIAGNOSIS — M25532 Pain in left wrist: Secondary | ICD-10-CM | POA: Diagnosis not present

## 2023-08-12 ENCOUNTER — Encounter: Payer: BC Managed Care – PPO | Admitting: Family Medicine

## 2023-08-24 DIAGNOSIS — Z01818 Encounter for other preprocedural examination: Secondary | ICD-10-CM | POA: Diagnosis not present

## 2023-09-09 DIAGNOSIS — M92212 Osteochondrosis (juvenile) of carpal lunate [Kienbock], left hand: Secondary | ICD-10-CM | POA: Diagnosis not present

## 2023-09-09 DIAGNOSIS — G8918 Other acute postprocedural pain: Secondary | ICD-10-CM | POA: Diagnosis not present

## 2023-09-09 DIAGNOSIS — M931 Kienbock's disease of adults: Secondary | ICD-10-CM | POA: Diagnosis not present

## 2023-09-09 DIAGNOSIS — M25832 Other specified joint disorders, left wrist: Secondary | ICD-10-CM | POA: Diagnosis not present

## 2023-09-27 DIAGNOSIS — M25532 Pain in left wrist: Secondary | ICD-10-CM | POA: Diagnosis not present

## 2023-09-27 DIAGNOSIS — Z4889 Encounter for other specified surgical aftercare: Secondary | ICD-10-CM | POA: Diagnosis not present

## 2023-10-25 DIAGNOSIS — Z978 Presence of other specified devices: Secondary | ICD-10-CM | POA: Diagnosis not present

## 2023-11-08 DIAGNOSIS — Z01818 Encounter for other preprocedural examination: Secondary | ICD-10-CM | POA: Diagnosis not present

## 2023-11-29 DIAGNOSIS — M931 Kienbock's disease of adults: Secondary | ICD-10-CM | POA: Diagnosis not present

## 2023-11-29 DIAGNOSIS — Z472 Encounter for removal of internal fixation device: Secondary | ICD-10-CM | POA: Diagnosis not present

## 2023-11-29 DIAGNOSIS — L910 Hypertrophic scar: Secondary | ICD-10-CM | POA: Diagnosis not present

## 2023-11-29 DIAGNOSIS — G8918 Other acute postprocedural pain: Secondary | ICD-10-CM | POA: Diagnosis not present

## 2023-12-20 DIAGNOSIS — Z4889 Encounter for other specified surgical aftercare: Secondary | ICD-10-CM | POA: Diagnosis not present

## 2023-12-29 DIAGNOSIS — M25532 Pain in left wrist: Secondary | ICD-10-CM | POA: Diagnosis not present

## 2024-01-05 DIAGNOSIS — M25532 Pain in left wrist: Secondary | ICD-10-CM | POA: Diagnosis not present

## 2024-01-17 DIAGNOSIS — M25532 Pain in left wrist: Secondary | ICD-10-CM | POA: Diagnosis not present

## 2024-01-27 DIAGNOSIS — M25532 Pain in left wrist: Secondary | ICD-10-CM | POA: Diagnosis not present

## 2024-03-27 ENCOUNTER — Ambulatory Visit: Payer: Self-pay

## 2024-03-27 ENCOUNTER — Encounter: Payer: Self-pay | Admitting: Family Medicine

## 2024-03-27 ENCOUNTER — Ambulatory Visit (INDEPENDENT_AMBULATORY_CARE_PROVIDER_SITE_OTHER): Admitting: Family Medicine

## 2024-03-27 VITALS — BP 139/84 | HR 85 | Temp 98.7°F | Resp 16 | Ht 64.17 in | Wt 360.6 lb

## 2024-03-27 DIAGNOSIS — J209 Acute bronchitis, unspecified: Secondary | ICD-10-CM | POA: Diagnosis not present

## 2024-03-27 MED ORDER — ALBUTEROL SULFATE HFA 108 (90 BASE) MCG/ACT IN AERS: 2.0000 | INHALATION_SPRAY | Freq: Four times a day (QID) | RESPIRATORY_TRACT | 2 refills | Status: DC | PRN

## 2024-03-27 NOTE — Telephone Encounter (Signed)
 Chief Complaint: cough Symptoms: cough, SOB, chest hurt, body ache Frequency: 4-5 days Pertinent Negatives: Patient denies fever, earaches, headaches Disposition: [] ED /[] Urgent Care (no appt availability in office) / [x] Appointment(In office/virtual)/ []  [place] [place] [facility]/ [] Home Care/ [] Refused Recommended Disposition /[] [place] [facility]/ []  Follow-up with PCP Additional Notes: Patient calling in for triage, speaking in full sentences with no labored breathing or audible wheezing noted. Home COVID test was negative. Patient states she has been taking Muccinex and OTC allergy pill; states she feels it is not helping her symptoms. No availability with PCP or at [facility] clinic. Patient agreeable with disposition to be seen today, scheduled for acute visit at [facility].  Copied from CRM [id number]. Topic: Appointments - Appointment Scheduling >> Mar 27, 2024  8:04 AM Beacher May wrote: Patient/patient representative is calling to schedule an appointment. Refer to attachments for appointment information. Pt is exeperiencing SOB. Warm transfer to nurse Reason for Disposition  [1] MILD difficulty breathing (e.g., minimal/no SOB at rest, SOB with walking, pulse <100) AND [2] still present when not coughing  Answer Assessment - Initial Assessment Questions 1. ONSET: "When did the cough begin?"      X 4-5 days.  2. SEVERITY: "How bad is the cough today?"      Patient states this morning her cough felt like she was going to throw up.  3. SPUTUM: "Describe the color of your sputum" (none, dry cough; clear, white, yellow, green)     Sometimes productive, green.  4. HEMOPTYSIS: "Are you coughing up any blood?" If so ask: "How much?" (flecks, streaks, tablespoons, etc.)     Denies.  5. DIFFICULTY BREATHING: "Are you having difficulty breathing?" If Yes, ask: "How bad is it?" (e.g., mild, moderate, severe)    - MILD: No SOB at rest, mild SOB with walking, speaks normally in sentences, can lie down, no  retractions, pulse < 100.    - MODERATE: SOB at rest, SOB with minimal exertion and prefers to sit, cannot lie down flat, speaks in phrases, mild retractions, audible wheezing, pulse 100-120.    - SEVERE: Very SOB at rest, speaks in single words, struggling to breathe, sitting hunched forward, retractions, pulse > 120      Yes, mild. No audible wheezing but patient states sometimes she experiences it after coughing or just when sitting.  6. FEVER: "Do you have a fever?" If Yes, ask: "What is your temperature, how was it measured, and when did it start?"     Denies, states temp this morning was 97.  7. CARDIAC HISTORY: "Do you have any history of heart disease?" (e.g., heart attack, congestive heart failure)      Denies.  8. LUNG HISTORY: "Do you have any history of lung disease?"  (e.g., pulmonary embolus, asthma, emphysema)     She states she has been given inhalers before for coughs but was not told she has asthma.  9. PE RISK FACTORS: "Do you have a history of blood clots?" (or: recent major surgery, recent prolonged travel, bedridden)     Denies.  10. OTHER SYMPTOMS: "Do you have any other symptoms?" (e.g., runny nose, wheezing, chest pain)       Chest hurts after coughing fit.  11. PREGNANCY: "Is there any chance you are pregnant?" "When was your last menstrual period?"       LMP: 02/21/24.  12. 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  Protocols used: Cough - Acute Productive-A-AH

## 2024-03-27 NOTE — Patient Instructions (Signed)
 Over the counter Medications (*unless allergic or contraindicated*):  Use Tylenol (acetaminophen) for fever/pain Use Advil (ibuprofen) for fever/pain/inflammation   Non-Medication Therapy:  Drink plenty of fluids and stay hydrated.  A teaspoon of honey may help ease coughing symptoms.  Cough drops or hard candy for coughing.   Over the Counter Medication Therapy:  Use a cough expectorant such as guaifenesin (Mucinex) if recommended by your doctor for a wet, congested cough. If you have high blood pressure, please ask your doctor first before using this.  Use a cough suppressant such as dextromethorphan (Robitussin/Delsym) for a dry cough. If you have high blood pressure, please ask your doctor first before using this.

## 2024-03-27 NOTE — Progress Notes (Signed)
 Acute Care Office Visit  Subjective:   Jody Alvarado 07-29-2001 03/27/2024  Chief Complaint  Patient presents with   Cough    Pt. C/o of a cough that started 03/23/24 along with wheezing and chest soreness due to the cough.    HPI: Presents today for an acute visit with complaint of cough.  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  HENT:  Negative for congestion, sinus pain and sore throat.   Respiratory:  Positive for cough, sputum production, shortness of breath and wheezing.   Cardiovascular:  Positive for chest pain (when she coughs).  Gastrointestinal:  Negative for abdominal pain, diarrhea, nausea and vomiting.  Genitourinary:  Negative for frequency and urgency.   The following portions of the patient's history were reviewed and updated as appropriate: past medical history, past surgical history, family history, social history, allergies, medications, and problem list.   Patient Active Problem List   Diagnosis Date Noted   Annual physical exam 06/03/2023   Morbid obesity (HCC) 03/19/2022   Past Medical History:  Diagnosis Date   Allergy    History reviewed. No pertinent surgical history. Family History  Problem Relation Age of Onset   Depression Mother    Anxiety disorder Mother    Anxiety disorder Maternal Uncle    Thyroid disease Maternal Grandmother    Outpatient Medications Prior to Visit  Medication Sig Dispense Refill   naproxen (NAPROSYN) 500 MG tablet TAKE 1 TABLET(500 MG) BY MOUTH TWICE DAILY WITH A MEAL (Patient not taking: Reported on 03/27/2024) 30 tablet 0   No facility-administered medications prior to visit.    Allergies  Allergen Reactions   Amoxicillin-Pot Clavulanate Rash   ROS: A complete ROS was performed with pertinent positives/negatives noted in the HPI. The remainder of the ROS are negative.    Objective:   Today's Vitals   03/27/24 0956  BP: 139/84  Pulse: 85  Resp: 16  Temp: 98.7 F (37.1 C)  TempSrc: Oral  SpO2: 96%  Weight: (!) 360 lb 9.6 oz (163.6 kg)  Height: 5' 4.17" (1.63 m)  PainSc: 4    GENERAL: Well-appearing, in NAD. Well nourished.  SKIN: Pink, warm and dry. No rash, lesion, ulceration, or ecchymoses.  Head: Normocephalic. NECK: Trachea midline. Full ROM w/o pain or tenderness. No lymphadenopathy.  EARS: Tympanic membranes are intact, translucent with bulging and fluid. No erythema or pus present. Appropriate landmarks visualized.  EYES: Conjunctiva clear without exudates. EOMI, PERRL, no drainage present.  NOSE: Septum midline w/o deformity. Nares patent, mucosa pink and non-inflamed w/o drainage. No sinus tenderness.  THROAT: Uvula midline. Oropharynx clear. Tonsils non-inflamed without exudate. Mucous membranes pink and moist.  RESPIRATORY: Chest wall symmetrical. Respirations even and non-labored. Expiratory wheezing present in posterior upper lung fields. Breath sounds clear to auscultation bilaterally.  CARDIAC: S1, S2 present, regular rate and rhythm without murmur or gallops. Peripheral pulses 2+ bilaterally.  MSK: Muscle tone and strength appropriate for age. Joints w/o tenderness, redness, or swelling.  EXTREMITIES: Without clubbing, cyanosis, or edema.  NEUROLOGIC: No motor or sensory deficits. Steady, even gait. C2-C12 intact.  PSYCH/MENTAL STATUS: Alert,  oriented x 3. Cooperative, appropriate mood and affect.     Assessment & Plan:   1. Acute bronchitis with wheezing (Primary) Patient presents today for concerns of an ongoing cough for the past few days with wheezing and shortness of breath. Denies fever/chills, sputum production, nasal  congestion, watery/itchy eyes, sneezing, headache, sinus pain/pressure, sore throat, N/V/D, abdominal pain. Vital signs stable. 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 HEENT without sinus tenderness to palpation, nasal turbinates pale with slight drainage, bilateral TMs bulging with fluid present without infection. Patient in no acute distress and is well-appearing. Cardiovascular exam with heart regular rate and rhythm. Normal heart sounds, no murmurs present. No lower extremity edema present. Expiratory wheezing present in posterior upper lung fields, other lung fields clear to auscultation bilaterally. Discussed allergic rhinitis most likely causing inflammation of upper respiratory tract. Counseled patient to use daily OTC allergy medication, albuterol for wheezing/shortness of breath, adequate hydration with Mucinex. Counseled patient to return to PCP if new, worrisome, or unresolved symptoms arise or if no improvement in patient's condition. Patient verbalized understanding and is agreeable to treatment plan. All questions answered to patient's satisfaction.  - albuterol (VENTOLIN HFA) 108 (90 Base) MCG/ACT inhaler; Inhale 2 puffs into the lungs every 6 (six) hours as needed for wheezing or shortness of breath.  Dispense: 8 g; Refill: 2   Meds ordered this encounter  Medications   albuterol (VENTOLIN HFA) 108 (90 Base) MCG/ACT inhaler    Sig: Inhale 2 puffs into the lungs every 6 (six) hours as needed for wheezing or shortness of breath.    Dispense:  8 g    Refill:  2    Supervising Provider:   ZIGLAR, SUSAN K [[id number]]   Return if symptoms worsen or fail to improve.    Jody Hanson, FNP

## 2024-03-29 ENCOUNTER — Encounter (HOSPITAL_BASED_OUTPATIENT_CLINIC_OR_DEPARTMENT_OTHER): Payer: Self-pay

## 2024-03-29 ENCOUNTER — Other Ambulatory Visit: Payer: Self-pay | Admitting: Family Medicine

## 2024-03-29 DIAGNOSIS — B9689 Other specified bacterial agents as the cause of diseases classified elsewhere: Secondary | ICD-10-CM

## 2024-03-29 MED ORDER — CEFDINIR 300 MG PO CAPS: 300.0000 mg | ORAL_CAPSULE | Freq: Two times a day (BID) | ORAL | 0 refills | Status: AC

## 2024-04-20 ENCOUNTER — Encounter (HOSPITAL_COMMUNITY): Payer: Self-pay

## 2024-04-27 ENCOUNTER — Encounter: Payer: Self-pay | Admitting: Family Medicine

## 2024-04-27 ENCOUNTER — Ambulatory Visit (INDEPENDENT_AMBULATORY_CARE_PROVIDER_SITE_OTHER): Admitting: Family Medicine

## 2024-04-27 VITALS — BP 105/76 | HR 88 | Ht 64.0 in | Wt 366.0 lb

## 2024-04-27 DIAGNOSIS — E559 Vitamin D deficiency, unspecified: Secondary | ICD-10-CM

## 2024-04-27 DIAGNOSIS — R519 Headache, unspecified: Secondary | ICD-10-CM

## 2024-04-27 DIAGNOSIS — E282 Polycystic ovarian syndrome: Secondary | ICD-10-CM

## 2024-04-27 DIAGNOSIS — Z13228 Encounter for screening for other metabolic disorders: Secondary | ICD-10-CM

## 2024-04-27 DIAGNOSIS — Z130 Encounter for screening for diseases of the blood and blood-forming organs and certain disorders involving the immune mechanism: Secondary | ICD-10-CM

## 2024-04-27 DIAGNOSIS — Z1329 Encounter for screening for other suspected endocrine disorder: Secondary | ICD-10-CM

## 2024-04-27 DIAGNOSIS — E7800 Pure hypercholesterolemia, unspecified: Secondary | ICD-10-CM

## 2024-04-27 DIAGNOSIS — N946 Dysmenorrhea, unspecified: Secondary | ICD-10-CM

## 2024-04-27 DIAGNOSIS — Z713 Dietary counseling and surveillance: Secondary | ICD-10-CM

## 2024-04-27 DIAGNOSIS — Z6841 Body Mass Index (BMI) 40.0 and over, adult: Secondary | ICD-10-CM

## 2024-04-27 DIAGNOSIS — R202 Paresthesia of skin: Secondary | ICD-10-CM

## 2024-04-27 MED ORDER — BUTALBITAL-APAP-CAFFEINE 50-325-40 MG PO TABS: 1.0000 | ORAL_TABLET | Freq: Four times a day (QID) | ORAL | 0 refills | Status: AC | PRN

## 2024-04-27 MED ORDER — BUPROPION HCL ER (SR) 150 MG PO TB12: ORAL_TABLET | ORAL | 2 refills | Status: AC

## 2024-04-27 MED ORDER — METFORMIN HCL ER 500 MG PO TB24: 500.0000 mg | ORAL_TABLET | Freq: Two times a day (BID) | ORAL | 2 refills | Status: AC

## 2024-04-27 MED ORDER — NALTREXONE HCL 50 MG PO TABS: 25.0000 mg | ORAL_TABLET | Freq: Every day | ORAL | 2 refills | Status: AC

## 2024-04-27 NOTE — Patient Instructions (Signed)
 Start metformin first.  After 1 week go ahead and start the bupropion.  A week after that, start the naltrexone.

## 2024-04-27 NOTE — Progress Notes (Signed)
 Established patient visit   Patient: Jody Alvarado   DOB: [birth year]   23 y.o. Female  MRN: [id number] Visit Date: 04/27/2024  Today's healthcare provider: Carlean Charter, DO   Chief Complaint  Patient presents with   Transfer of Care    Patient previously seen by Iona Manis, FNP and present to establish care with new provider and discuss weigh concerns as well as possible pcos. She reports also having bad headaches on right side of temple   Weight Management Screening    Patient reports she would like to look into metformin  for weight loss as well as pcos as she has heard it will assist. Previoulsy took wegovy  and it helped but since stopping weight has increased again. Reports hx of borderline thyroid concerns when younger and not sure if it could possibly be a concern now.    PCOS    Concerns of PCOS due to going all of April with no menstrual. Reports irregular menstruals since starting it, concerns with hr weight gain and hair thinning over the last year. Home pregnancy test taken and reports negative.    Headache    Headaches X 1 week and taking ibuprofen 800 mg that gives relief sometimes. Reports she is having them when going into work and she will take it but it lingers slightly throughout the day and sometimes she may take more sometimes she wont.    Subjective    HPI Jody Alvarado is a 23 year old female who presents for evaluation of possible PCOS.  She has experienced irregular menstrual cycles since adolescence, with periods that are sometimes absent for months and other times occurring consecutively. Her periods typically last five days when they occur, and she experiences severe cramps that are alleviated with ibuprofen. No ultrasounds have been performed to evaluate her menstrual irregularities.  She has gained approximately 30 pounds since last summer. She has tried various diets and medications, including a course of Lexiva, which initially helped but was  discontinued due to gastrointestinal side effects such as constipation and nausea. She has not tried metformin . No significant family history of diabetes is reported. No symptoms of increased thirst, urination, or appetite.  She experiences headaches primarily on the right side, occurring daily for the past week, mostly in the mornings. She has a history of migraines but has not had them recently until this episode. She previously used Nurtec samples for migraines with good effect.  She reports numbness in her leg, which has been present for about a year, and is concerned it may be related to her weight.  She has a history of low vitamin D  levels and was advised to take supplements, though she is unsure if she is currently taking them. She does not take any B12 supplements.  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 She feels sleepy during the day, which she attributes to her early work hours and active job. She occasionally naps during her breaks. No significant hair growth on the chin, reports hair thinning in the past, no loud snoring or observed apneas, no frequent nighttime urination, no increased thirst or urination.      Medications: Outpatient Medications Prior to Visit  Medication Sig   albuterol  (VENTOLIN  HFA) 108 (90 Base) MCG/ACT inhaler Inhale 2 puffs into the lungs every 6 (six) hours as needed for wheezing or shortness of breath. (Patient not taking: Reported on 04/27/2024)  No facility-administered medications prior to visit.        Objective    BP 105/76 (BP Location: Right Arm, Patient Position: Sitting, Cuff Size: Large)   Pulse 88   Ht 5\' 4"  (1.626 m)   Wt (!) 366 lb (166 kg)   SpO2 99%   BMI 62.82 kg/m     Physical Exam Vitals and nursing note reviewed.  Constitutional:      General: She is not in acute distress.    Appearance: Normal appearance.  HENT:     Head: Normocephalic and atraumatic.   Eyes:     General: No scleral icterus.    Conjunctiva/sclera: Conjunctivae normal.  Cardiovascular:     Rate and Rhythm: Normal rate.  Pulmonary:     Effort: Pulmonary effort is normal.  Neurological:     Mental Status: She is alert and oriented to person, place, and time. Mental status is at baseline.  Psychiatric:        Mood and Affect: Mood normal.        Behavior: Behavior normal.      No results found for any visits on 04/27/24.  Assessment & Plan    Polycystic ovarian disease -     metFORMIN  HCl ER; Take 1 tablet (500 mg total) by mouth 2 (two) times daily with a meal. Take 1 tablet daily for one week, take 1 tablets twice daily  Dispense: 60 tablet; Refill: 2 -     Testosterone  Dysmenorrhea  Morbid obesity with BMI of 60.0-69.9, adult (HCC) -     TSH Rfx on Abnormal to Free T4 -     buPROPion  HCl ER (SR); 150 mg orally once daily for 3 days, then increase to 300 mg/day  Dispense: 60 tablet; Refill: 2 -     Naltrexone  HCl; Take 0.5-1 tablets (25-50 mg total) by mouth daily.  Dispense: 30 tablet; Refill: 2  Weight loss counseling, encounter for -     buPROPion  HCl ER (SR); 150 mg orally once daily for 3 days, then increase to 300 mg/day  Dispense: 60 tablet; Refill: 2 -     Naltrexone  HCl; Take 0.5-1 tablets (25-50 mg total) by mouth daily.  Dispense: 30 tablet; Refill: 2  Morning headache -     Butalbital -APAP-Caffeine ; Take 1 tablet by mouth every 6 (six) hours as needed for headache.  Dispense: 14 tablet; Refill: 0  Right-sided headache -     Butalbital -APAP-Caffeine ; Take 1 tablet by mouth every 6 (six) hours as needed for headache.  Dispense: 14 tablet; Refill: 0  Paresthesia of left leg -     Vitamin B12  Elevated LDL cholesterol level -     Comprehensive metabolic panel with GFR -     Lipid panel  Screening for endocrine, metabolic and immunity disorder -     Comprehensive metabolic panel with GFR  Vitamin D  deficiency -     VITAMIN D  25 Hydroxy  (Vit-D Deficiency, Fractures)      Polycystic Ovary Syndrome (PCOS); dysmenorrhea Irregular periods and weight gain suggest PCOS. No prior ultrasound. Metformin  considered for period regulation and weight management. Discussed metformin  side effects and dose tapering. - Prescribe metformin  extended release, start with one tablet daily, increase to one tablet twice daily after one week if tolerated. - Check testosterone levels to support PCOS diagnosis. - Encourage weight management through diet and exercise.  Morbid obesity with BMI 60.0-69.9; weight loss counseling Weight gain and difficulty losing weight. Previous use of Wegovy  with  weight regain. Discussed weight loss medications. Recommended Contrave due to activity levels and insurance. - Prescribe Contrave (bupropion  and naltrexone ) for weight management, start bupropion  one week after metformin , then add naltrexone  one week later. - Encourage increased physical activity and dietary modifications.  Headaches Recent right-sided morning headaches with migraine history. Considered sleep apnea; however this is unlikely at this point given minimal symptoms and fairly low screening scores. Discussed home sleep study; defer for now. Considered Fioricet for management. ESS score 2 STOP-BANG score 3 - intermediate  - Prescribe Fioricet for headache management.  Paresthesia in leg Numbness for a year, possibly related to weight. Considered meralgia paresthetica. Discussed checking vitamin D  and B12 levels. - Check vitamin D  and B12 levels.  Vitamin D  deficiency Previously low vitamin D  levels, not on supplement. Plan to recheck levels. - Check vitamin D  levels.  Elevated cholesterol Mildly elevated cholesterol, not on medication. Discussed fasting blood work for accurate panel. - Order fasting blood work to recheck cholesterol levels.    Return in about 3 months (around 07/28/2024) for Weight, CPE.      I discussed the assessment  and treatment plan with the patient  The patient was provided an opportunity to ask questions and all were answered. The patient agreed with the plan and demonstrated an understanding of the instructions.   The patient was advised to call back or seek an in-person evaluation if the symptoms worsen or if the condition fails to improve as anticipated.  Total time was 40 minutes. That includes chart review before the visit, the actual patient visit, and time spent on documentation after the visit.    Carlean Charter, DO  [facility] [phone] (phone) [phone] (fax)  [facility]

## 2024-04-30 ENCOUNTER — Encounter: Payer: Self-pay | Admitting: Family Medicine

## 2024-05-06 NOTE — Progress Notes (Incomplete)
 Established patient visit   Patient: Jody Alvarado   DOB: [birth year]   22 y.o. Female  MRN: [id number] Visit Date: 04/27/2024  Today's healthcare provider: Carlean Charter, DO   Chief Complaint  Patient presents with  . Transfer of Care    Patient previously seen by Iona Manis, FNP and present to establish care with new provider and discuss weigh concerns as well as possible pcos. She reports also having bad headaches on right side of temple  . Weight Management Screening    Patient reports she would like to look into metformin  for weight loss as well as pcos as she has heard it will assist. Previoulsy took wegovy  and it helped but since stopping weight has increased again. Reports hx of borderline thyroid concerns when younger and not sure if it could possibly be a concern now.   Aaron Aas PCOS    Concerns of PCOS due to going all of April with no menstrual. Reports irregular menstruals since starting it, concerns with hr weight gain and hair thinning over the last year. Home pregnancy test taken and reports negative.   Aaron Aas Headache    Headaches X 1 week and taking ibuprofen 800 mg that gives relief sometimes. Reports she is having them when going into work and she will take it but it lingers slightly throughout the day and sometimes she may take more sometimes she wont.    Subjective    HPI Jody Alvarado is a 23 year old female who presents for evaluation of possible PCOS.  She has experienced irregular menstrual cycles since adolescence, with periods that are sometimes absent for months and other times occurring consecutively. Her periods typically last five days when they occur, and she experiences severe cramps that are alleviated with ibuprofen. No ultrasounds have been performed to evaluate her menstrual irregularities.  She has gained approximately 30 pounds since last summer. She has tried various diets and medications, including a course of Lexiva, which initially helped but was  discontinued due to gastrointestinal side effects such as constipation and nausea. She has not tried metformin . No significant family history of diabetes is reported. No symptoms of increased thirst, urination, or appetite.  She experiences headaches primarily on the right side, occurring daily for the past week, mostly in the mornings. She has a history of migraines but has not had them recently until this episode. She previously used Nurtec samples for migraines with good effect.  She reports numbness in her leg, which has been present for about a year, and is concerned it may be related to her weight.  She has a history of low vitamin D  levels and was advised to take supplements, though she is unsure if she is currently taking them. She does not take any B12 supplements.  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 She feels sleepy during the day, which she attributes to her early work hours and active job. She occasionally naps during her breaks. No significant hair growth on the chin, reports hair thinning in the past, no loud snoring or observed apneas, no frequent nighttime urination, no increased thirst or urination.    ESS score 2 STOP-BANG score 3 - intermediate  ***  {History (Optional):23778}  Medications: Outpatient Medications Prior to Visit  Medication Sig  . albuterol  (VENTOLIN  HFA) 108 (90 Base) MCG/ACT inhaler Inhale 2 puffs into the lungs every 6 (six) hours as needed for  wheezing or shortness of breath. (Patient not taking: Reported on 04/27/2024)   No facility-administered medications prior to visit.    Review of Systems ***  {Insert previous labs (optional):23779} {See past labs  Heme  Chem  Endocrine  Serology  Results Review (optional):1}   Objective    BP 105/76 (BP Location: Right Arm, Patient Position: Sitting, Cuff Size: Large)   Pulse 88   Ht 5\' 4"  (1.626 m)   Wt (!) 366 lb (166 kg)   SpO2  99%   BMI 62.82 kg/m  {Insert last BP/Wt (optional):23777}{See vitals history (optional):1}   Physical Exam   No results found for any visits on 04/27/24.  Assessment & Plan    There are no diagnoses linked to this encounter.    Polycystic Ovary Syndrome (PCOS) Irregular periods and weight gain suggest PCOS. No prior ultrasound. Metformin  considered for period regulation and weight management. Discussed metformin  side effects and dose tapering. - Prescribe metformin  extended release, start with one tablet daily, increase to one tablet twice daily after one week if tolerated. - Check testosterone levels to support PCOS diagnosis. - Encourage weight management through diet and exercise.  Obesity Weight gain and difficulty losing weight. Previous use of Wegovy  with weight regain. Discussed weight loss medications. Recommended Contrave due to activity levels and insurance. - Prescribe Contrave (bupropion  and naltrexone ) for weight management, start bupropion  one week after metformin , then add naltrexone  one week later. - Encourage increased physical activity and dietary modifications.  Headaches Recent right-sided morning headaches with migraine history. Considered sleep apnea. Discussed home sleep study. Considered Fioricet for management. - Order home sleep study to evaluate for sleep apnea. - Prescribe Fioricet for headache management.  Numbness in leg Numbness for a year, possibly related to weight. Considered meralgia paresthetica. Discussed checking vitamin D  and B12 levels. - Check vitamin D  and B12 levels.  Vitamin D  deficiency Previously low vitamin D  levels, not on supplement. Plan to recheck levels. - Check vitamin D  levels.  Elevated cholesterol Mildly elevated cholesterol, not on medication. Discussed fasting blood work for accurate panel. - Order fasting blood work to recheck cholesterol levels. ***  No follow-ups on file.      I discussed the assessment and  treatment plan with the patient  The patient was provided an opportunity to ask questions and all were answered. The patient agreed with the plan and demonstrated an understanding of the instructions.   The patient was advised to call back or seek an in-person evaluation if the symptoms worsen or if the condition fails to improve as anticipated.    Carlean Charter, DO  [facility] [phone] (phone) [phone] (fax)  [facility]

## 2024-05-09 DIAGNOSIS — Z13228 Encounter for screening for other metabolic disorders: Secondary | ICD-10-CM | POA: Diagnosis not present

## 2024-05-09 DIAGNOSIS — E7800 Pure hypercholesterolemia, unspecified: Secondary | ICD-10-CM | POA: Diagnosis not present

## 2024-05-09 DIAGNOSIS — R202 Paresthesia of skin: Secondary | ICD-10-CM | POA: Diagnosis not present

## 2024-05-09 DIAGNOSIS — E559 Vitamin D deficiency, unspecified: Secondary | ICD-10-CM | POA: Diagnosis not present

## 2024-05-09 DIAGNOSIS — E282 Polycystic ovarian syndrome: Secondary | ICD-10-CM | POA: Diagnosis not present

## 2024-05-09 DIAGNOSIS — Z1329 Encounter for screening for other suspected endocrine disorder: Secondary | ICD-10-CM | POA: Diagnosis not present

## 2024-05-10 ENCOUNTER — Ambulatory Visit: Payer: Self-pay | Admitting: Family Medicine

## 2024-05-12 LAB — COMPREHENSIVE METABOLIC PANEL WITH GFR
ALT: 12 IU/L (ref 0–32)
AST: 16 IU/L (ref 0–40)
Albumin: 3.9 g/dL — ABNORMAL LOW (ref 4.0–5.0)
Alkaline Phosphatase: 125 IU/L — ABNORMAL HIGH (ref 42–106)
BUN/Creatinine Ratio: 32 — ABNORMAL HIGH (ref 9–23)
BUN: 17 mg/dL (ref 6–20)
Bilirubin Total: 0.3 mg/dL (ref 0.0–1.2)
CO2: 22 mmol/L (ref 20–29)
Calcium: 9 mg/dL (ref 8.7–10.2)
Chloride: 102 mmol/L (ref 96–106)
Creatinine, Ser: 0.53 mg/dL — ABNORMAL LOW (ref 0.57–1.00)
Globulin, Total: 2.7 g/dL (ref 1.5–4.5)
Glucose: 101 mg/dL — ABNORMAL HIGH (ref 70–99)
Potassium: 4.3 mmol/L (ref 3.5–5.2)
Sodium: 138 mmol/L (ref 134–144)
Total Protein: 6.6 g/dL (ref 6.0–8.5)
eGFR: 136 mL/min/{1.73_m2} (ref >59)

## 2024-05-12 LAB — LIPID PANEL
Chol/HDL Ratio: 3.8 ratio (ref 0.0–4.4)
Cholesterol, Total: 177 mg/dL (ref 100–199)
HDL: 46 mg/dL (ref >39)
LDL Chol Calc (NIH): 110 mg/dL — ABNORMAL HIGH (ref 0–99)
Triglycerides: 115 mg/dL (ref 0–149)
VLDL Cholesterol Cal: 21 mg/dL (ref 5–40)

## 2024-05-12 LAB — VITAMIN D 25 HYDROXY (VIT D DEFICIENCY, FRACTURES): Vit D, 25-Hydroxy: 21.7 ng/mL — ABNORMAL LOW (ref 30.0–100.0)

## 2024-05-12 LAB — TESTOSTERONE: Testosterone: 57 ng/dL (ref 13–71)

## 2024-05-12 LAB — TSH RFX ON ABNORMAL TO FREE T4: TSH: 3.41 u[IU]/mL (ref 0.450–4.500)

## 2024-05-12 LAB — VITAMIN B12: Vitamin B-12: 526 pg/mL (ref 232–1245)

## 2024-05-30 ENCOUNTER — Ambulatory Visit: Admitting: Family Medicine

## 2024-05-30 ENCOUNTER — Encounter: Payer: Self-pay | Admitting: Family Medicine

## 2024-05-30 NOTE — Progress Notes (Unsigned)
      Established patient visit   Patient: Jody Alvarado   DOB: 05-25-2001   23 y.o. Female  MRN: [id number] Visit Date: 05/30/2024  Today's healthcare provider: Carlean Charter, DO   Chief Complaint  Patient presents with   Follow-up   Subjective    HPI    ***  {History (Optional):23778}  Medications: Outpatient Medications Prior to Visit  Medication Sig   buPROPion  (WELLBUTRIN  SR) 150 MG 12 hr tablet 150 mg orally once daily for 3 days, then increase to 300 mg/day   butalbital -acetaminophen-caffeine  (FIORICET) 50-325-40 MG tablet Take 1 tablet by mouth every 6 (six) hours as needed for headache.   metFORMIN  (GLUCOPHAGE -XR) 500 MG 24 hr tablet Take 1 tablet (500 mg total) by mouth 2 (two) times daily with a meal. Take 1 tablet daily for one week, take 1 tablets twice daily   naltrexone  (DEPADE) 50 MG tablet Take 0.5-1 tablets (25-50 mg total) by mouth daily.   albuterol  (VENTOLIN  HFA) 108 (90 Base) MCG/ACT inhaler Inhale 2 puffs into the lungs every 6 (six) hours as needed for wheezing or shortness of breath. (Patient not taking: Reported on 05/30/2024)   No facility-administered medications prior to visit.    Review of Systems ***  {Insert previous labs (optional):23779} {See past labs  Heme  Chem  Endocrine  Serology  Results Review (optional):1}   Objective    BP 130/71 (BP Location: Left Arm, Patient Position: Sitting, Cuff Size: Large)   Pulse 67   Ht 5' 4 (1.626 m)   Wt (!) 363 lb (164.7 kg)   LMP 05/22/2024   SpO2 100%   BMI 62.31 kg/m  {Insert last BP/Wt (optional):23777}{See vitals history (optional):1}   Physical Exam   No results found for any visits on 05/30/24.  Assessment & Plan    There are no diagnoses linked to this encounter.  ***  No follow-ups on file.      I discussed the assessment and treatment plan with the patient  The patient was provided an opportunity to ask questions and all were answered. The patient agreed with the  plan and demonstrated an understanding of the instructions.   The patient was advised to call back or seek an in-person evaluation if the symptoms worsen or if the condition fails to improve as anticipated.    Carlean Charter, DO  [facility] [phone] (phone) [phone] (fax)  [facility]

## 2024-06-04 ENCOUNTER — Encounter: Payer: BC Managed Care – PPO | Admitting: Family Medicine

## 2024-06-25 ENCOUNTER — Ambulatory Visit: Admitting: Family Medicine

## 2024-06-25 ENCOUNTER — Ambulatory Visit: Payer: Self-pay

## 2024-06-25 ENCOUNTER — Encounter: Payer: Self-pay | Admitting: Family Medicine

## 2024-06-25 VITALS — BP 138/89 | HR 81 | Temp 97.6°F | Resp 20 | Ht 64.0 in | Wt 357.0 lb

## 2024-06-25 DIAGNOSIS — R220 Localized swelling, mass and lump, head: Secondary | ICD-10-CM | POA: Insufficient documentation

## 2024-06-25 DIAGNOSIS — W57XXXA Bitten or stung by nonvenomous insect and other nonvenomous arthropods, initial encounter: Secondary | ICD-10-CM | POA: Insufficient documentation

## 2024-06-25 MED ORDER — PREDNISONE 20 MG PO TABS: 40.0000 mg | ORAL_TABLET | Freq: Every day | ORAL | 0 refills | Status: AC

## 2024-06-25 NOTE — Assessment & Plan Note (Signed)
 Many bites on her legs, arms, dorsum of hands.  Benadryl 50mg  q 8 hours for 2 days.

## 2024-06-25 NOTE — Telephone Encounter (Signed)
 FYI Only or Action Required?: FYI only for provider.  Patient was last seen in primary care on 04/27/2024 by Donzella Lauraine SAILOR, DO.  Called Nurse Triage reporting No chief complaint on file..  Symptoms began today.  Interventions attempted: Nothing.  Symptoms are: gradually worsening.  Triage Disposition: See HCP Within 4 Hours (Or PCP Triage)  Patient/caregiver understands and will follow disposition?: Yes   Copied from CRM [id number]. Topic: Clinical - Red Word Triage >> Jun 25, 2024  8:03 AM Charlet HERO wrote: Red Word that prompted transfer to Nurse Triage:Paitent woke up face swollen ,hives and itchy. Not sure why this happening Reason for Disposition  SEVERE face swelling (e.g., entire face)  Answer Assessment - Initial Assessment Questions 1. ONSET: When did the swelling start? (e.g., minutes, hours, days)     This morning  2. LOCATION: What part of the face is swollen? (e.g., cheek, entire face, jaw joint area, under jaw)     Entire Face  3. SEVERITY: How swollen is it?     Noticeably enough  4. ITCHING: Is there any itching? If Yes, ask: How much?   (Scale 1-10; mild, moderate or severe)     Itching, Moderate  5. PAIN: Is the swelling painful to touch? If Yes, ask: How painful is it?   (Scale 0-10; mild, moderate or severe)     A little bit, itches too. Mild  6. FEVER: Do you have a fever? If Yes, ask: What is it, how was it measured, and when did it start?      No  7. CAUSE: What do you think is causing the face swelling?     Unsure  8. NEW MEDICINES: Have there been any new medicines started recently?     Weight loss medication for a month, Metformin , Bupropion   9. RECURRENT SYMPTOM: Have you had face swelling before? If Yes, ask: When was the last time? What happened that time?     No  10. OTHER SYMPTOMS: Do you have any other symptoms? (e.g., leg swelling, toothache)       Hives  11. PREGNANCY: Is there any chance you are  pregnant? When was your last menstrual period?       No  Protocols used: Face Swelling-A-AH

## 2024-06-25 NOTE — Progress Notes (Signed)
 New Patient Office Visit  Subjective    Patient ID: Jody Alvarado, female    DOB: 11/24/2001  Age: 23 y.o. MRN: [id number]  CC:  Chief Complaint  Patient presents with   Facial Swelling   Rash   Pruritis    HPI Discussed the use of AI scribe software for clinical note transcription with the patient, who gave verbal consent to proceed.  History of Present Illness   Jody Alvarado is a 23 year old female who presents with facial and hand swelling and itching after mosquito bites.  She is accompanied by her mother.    She experiences significant itching and swelling of her face and hands, which she attributes to mosquito bites. The swelling began this morning after being bitten by mosquitoes last night while outside briefly. The itching is intense, and her face was more swollen earlier in the day.  She has never experienced facial swelling of this nature before, although mosquito bites typically cause her skin to 'wump up'. No pain, dental pain, or difficulty swallowing.  She denies SOB or wheezing.    Her current medications include metformin , naltrexone , and bupropion  for weight loss, which she has been taking for about a month. She has lost nine pounds. She has not tried any new products recently, except for taking collagen powder once about a week ago. Her diet last night included Hawaiian chicken with pineapple, potatoes, and homemade ice cream.  During the review of symptoms, she denies any pain, dental pain, or trouble swallowing. She has not taken any antihistamines like Benadryl recently.      Outpatient Encounter Medications as of 06/25/2024  Medication Sig   buPROPion  (WELLBUTRIN  SR) 150 MG 12 hr tablet 150 mg orally once daily for 3 days, then increase to 300 mg/day   butalbital -acetaminophen-caffeine  (FIORICET) 50-325-40 MG tablet Take 1 tablet by mouth every 6 (six) hours as needed for headache.   metFORMIN  (GLUCOPHAGE -XR) 500 MG 24 hr tablet Take 1 tablet (500 mg total)  by mouth 2 (two) times daily with a meal. Take 1 tablet daily for one week, take 1 tablets twice daily   naltrexone  (DEPADE) 50 MG tablet Take 0.5-1 tablets (25-50 mg total) by mouth daily.   predniSONE  (DELTASONE ) 20 MG tablet Take 2 tablets (40 mg total) by mouth daily with breakfast for 5 days.   albuterol  (VENTOLIN  HFA) 108 (90 Base) MCG/ACT inhaler Inhale 2 puffs into the lungs every 6 (six) hours as needed for wheezing or shortness of breath. (Patient not taking: Reported on 05/30/2024)   No facility-administered encounter medications on file as of 06/25/2024.    Past Medical History:  Diagnosis Date   Allergy     History reviewed. No pertinent surgical history.  Family History  Problem Relation Age of Onset   Depression Mother    Anxiety disorder Mother    Anxiety disorder Maternal Uncle    Thyroid  disease Maternal Grandmother     Social History   Socioeconomic History   Marital status: Alvarado    Spouse name: Not on file   Number of children: Not on file   Years of education: Not on file   Highest education level: Not on file  Occupational History   Not on file  Tobacco Use   Smoking status: Unknown   Smokeless tobacco: Not on file  Vaping Use   Vaping status: Not on file  Substance and Sexual Activity   Alcohol use: Not Currently   Drug use: Not Currently  Sexual activity: Not Currently  Other Topics Concern   Not on file  Social History Narrative   Not on file   Social Drivers of Health   Financial Resource Strain: Not on file  Food Insecurity: Not on file  Transportation Needs: Not on file  Physical Activity: Not on file  Stress: Not on file  Social Connections: Not on file  Intimate Partner Violence: Not on file    ROS      Objective   BP 138/89 (BP Location: Right Arm, Patient Position: Sitting, Cuff Size: Large)   Pulse 81   Temp 97.6 F (36.4 C) (Oral)   Resp 20   Ht 5' 4 (1.626 m)   Wt (!) 357 lb (161.9 kg)   LMP 05/22/2024   BMI  61.28 kg/m    Physical Exam Vitals and nursing note reviewed.  Constitutional:      Appearance: Normal appearance.  HENT:     Head: Normocephalic and atraumatic.     Comments: Swelling of face, periorbital and in her cheeks.      Mouth/Throat:     Lips: Pink.     Mouth: Mucous membranes are moist. No angioedema.     Pharynx: No pharyngeal swelling or uvula swelling.  Eyes:     Conjunctiva/sclera: Conjunctivae normal.  Cardiovascular:     Rate and Rhythm: Normal rate and regular rhythm.  Pulmonary:     Effort: Pulmonary effort is normal.     Breath sounds: Normal breath sounds.  Musculoskeletal:     Right lower leg: No edema.     Left lower leg: No edema.  Skin:    General: Skin is warm and dry.     Comments: Multiple 1cm edematous papuloes on arms and legs.    Neurological:     Mental Status: She is alert and oriented to person, place, and time.  Psychiatric:        Mood and Affect: Mood normal.        Behavior: Behavior normal.        Thought Content: Thought content normal.        Judgment: Judgment normal.            The ASCVD Risk score (Arnett DK, et al., 2019) failed to calculate for the following reasons:   The 2019 ASCVD risk score is only valid for ages 20 to 47     Assessment & Plan:  Facial swelling Assessment & Plan: Has facial edema, throat is clear.  Lungs are clear.  Prednisone  40mg  daily for 5 days.  FOLLOW-UP if not resolved.    Orders: -     predniSONE ; Take 2 tablets (40 mg total) by mouth daily with breakfast for 5 days.  Dispense: 10 tablet; Refill: 0  Mosquito bite, initial encounter Assessment & Plan: Many bites on her legs, arms, dorsum of hands.  Benadryl 50mg  q 8 hours for 2 days.       Return if symptoms worsen or fail to improve.   Steve Gregg K Valisa Karpel, MD

## 2024-06-25 NOTE — Assessment & Plan Note (Signed)
 Has facial edema, throat is clear.  Lungs are clear.  Prednisone  40mg  daily for 5 days.  FOLLOW-UP if not resolved.

## 2024-06-30 ENCOUNTER — Encounter: Payer: Self-pay | Admitting: Family Medicine

## 2024-07-01 ENCOUNTER — Encounter: Payer: Self-pay | Admitting: Family Medicine

## 2024-07-01 DIAGNOSIS — Z713 Dietary counseling and surveillance: Secondary | ICD-10-CM

## 2024-07-02 ENCOUNTER — Other Ambulatory Visit: Payer: Self-pay | Admitting: Family Medicine

## 2024-07-02 DIAGNOSIS — L509 Urticaria, unspecified: Secondary | ICD-10-CM

## 2024-07-02 NOTE — Telephone Encounter (Signed)
 Patient was seen 06/25/24 for facial swelling and provided prednisone  40 mg by Dr.Ziglar

## 2024-07-07 ENCOUNTER — Other Ambulatory Visit: Payer: Self-pay | Admitting: Family Medicine

## 2024-07-07 DIAGNOSIS — Z713 Dietary counseling and surveillance: Secondary | ICD-10-CM

## 2024-07-17 DIAGNOSIS — M92212 Osteochondrosis (juvenile) of carpal lunate [Kienbock], left hand: Secondary | ICD-10-CM | POA: Diagnosis not present

## 2024-07-17 DIAGNOSIS — Z566 Other physical and mental strain related to work: Secondary | ICD-10-CM | POA: Diagnosis not present

## 2024-07-17 DIAGNOSIS — M931 Kienbock's disease of adults: Secondary | ICD-10-CM | POA: Diagnosis not present

## 2024-07-19 ENCOUNTER — Telehealth: Payer: Self-pay | Admitting: Family Medicine

## 2024-07-19 NOTE — Telephone Encounter (Signed)
 Opened in error

## 2024-07-21 MED ORDER — TOPIRAMATE 25 MG PO CPSP: ORAL_CAPSULE | ORAL | 1 refills | Status: AC

## 2024-07-23 ENCOUNTER — Ambulatory Visit: Admitting: Physician Assistant

## 2024-07-23 ENCOUNTER — Other Ambulatory Visit: Payer: Self-pay

## 2024-07-23 ENCOUNTER — Emergency Department

## 2024-07-23 ENCOUNTER — Emergency Department
Admission: EM | Admit: 2024-07-23 | Discharge: 2024-07-23 | Disposition: A | Discharge status: Home / Self Care | Attending: Emergency Medicine | Admitting: Emergency Medicine

## 2024-07-23 ENCOUNTER — Encounter: Payer: Self-pay | Admitting: Physician Assistant

## 2024-07-23 VITALS — BP 149/83 | HR 131 | Temp 98.6°F | Ht 64.0 in | Wt 354.9 lb

## 2024-07-23 DIAGNOSIS — R0981 Nasal congestion: Secondary | ICD-10-CM | POA: Insufficient documentation

## 2024-07-23 DIAGNOSIS — R161 Splenomegaly, not elsewhere classified: Secondary | ICD-10-CM | POA: Diagnosis not present

## 2024-07-23 DIAGNOSIS — R079 Chest pain, unspecified: Secondary | ICD-10-CM | POA: Diagnosis not present

## 2024-07-23 DIAGNOSIS — R062 Wheezing: Secondary | ICD-10-CM | POA: Diagnosis not present

## 2024-07-23 DIAGNOSIS — R000 Tachycardia, unspecified: Secondary | ICD-10-CM | POA: Diagnosis not present

## 2024-07-23 DIAGNOSIS — R590 Localized enlarged lymph nodes: Secondary | ICD-10-CM | POA: Diagnosis not present

## 2024-07-23 DIAGNOSIS — R051 Acute cough: Secondary | ICD-10-CM

## 2024-07-23 DIAGNOSIS — R509 Fever, unspecified: Secondary | ICD-10-CM | POA: Diagnosis not present

## 2024-07-23 DIAGNOSIS — R0602 Shortness of breath: Secondary | ICD-10-CM

## 2024-07-23 DIAGNOSIS — J189 Pneumonia, unspecified organism: Secondary | ICD-10-CM | POA: Diagnosis not present

## 2024-07-23 DIAGNOSIS — R918 Other nonspecific abnormal finding of lung field: Secondary | ICD-10-CM | POA: Diagnosis not present

## 2024-07-23 DIAGNOSIS — R059 Cough, unspecified: Secondary | ICD-10-CM | POA: Diagnosis not present

## 2024-07-23 LAB — CBC WITH DIFFERENTIAL/PLATELET
Abs Immature Granulocytes: 0.03 K/uL (ref 0.00–0.07)
Basophils Absolute: 0 K/uL (ref 0.0–0.1)
Basophils Relative: 0 %
Eosinophils Absolute: 0 K/uL (ref 0.0–0.5)
Eosinophils Relative: 0 %
HCT: 36.9 % (ref 36.0–46.0)
Hemoglobin: 12 g/dL (ref 12.0–15.0)
Immature Granulocytes: 1 %
Lymphocytes Relative: 15 %
Lymphs Abs: 0.9 K/uL (ref 0.7–4.0)
MCH: 26.8 pg (ref 26.0–34.0)
MCHC: 32.5 g/dL (ref 30.0–36.0)
MCV: 82.6 fL (ref 80.0–100.0)
Monocytes Absolute: 0.4 K/uL (ref 0.1–1.0)
Monocytes Relative: 7 %
Neutro Abs: 4.4 K/uL (ref 1.7–7.7)
Neutrophils Relative %: 77 %
Platelets: 220 K/uL (ref 150–400)
RBC: 4.47 MIL/uL (ref 3.87–5.11)
RDW: 14.4 % (ref 11.5–15.5)
WBC: 5.8 K/uL (ref 4.0–10.5)
nRBC: 0 % (ref 0.0–0.2)

## 2024-07-23 LAB — COMPREHENSIVE METABOLIC PANEL WITH GFR
ALT: 21 U/L (ref 0–44)
AST: 18 U/L (ref 15–41)
Albumin: 3.8 g/dL (ref 3.5–5.0)
Alkaline Phosphatase: 107 U/L (ref 38–126)
Anion gap: 12 (ref 5–15)
BUN: 12 mg/dL (ref 6–20)
CO2: 26 mmol/L (ref 22–32)
Calcium: 9.1 mg/dL (ref 8.9–10.3)
Chloride: 103 mmol/L (ref 98–111)
Creatinine, Ser: 0.5 mg/dL (ref 0.44–1.00)
GFR, Estimated: >60 mL/min (ref >60)
Glucose, Bld: 108 mg/dL — ABNORMAL HIGH (ref 70–99)
Potassium: 3.6 mmol/L (ref 3.5–5.1)
Sodium: 141 mmol/L (ref 135–145)
Total Bilirubin: 0.6 mg/dL (ref 0.0–1.2)
Total Protein: 7.4 g/dL (ref 6.5–8.1)

## 2024-07-23 LAB — LACTIC ACID, PLASMA: Lactic Acid, Venous: 1 mmol/L (ref 0.5–1.9)

## 2024-07-23 LAB — RESP PANEL BY RT-PCR (RSV, FLU A&B, COVID)  RVPGX2
Influenza A by PCR: NEGATIVE
Influenza B by PCR: NEGATIVE
Resp Syncytial Virus by PCR: NEGATIVE
SARS Coronavirus 2 by RT PCR: NEGATIVE

## 2024-07-23 LAB — TSH: TSH: 2.056 u[IU]/mL (ref 0.350–4.500)

## 2024-07-23 LAB — TROPONIN I (HIGH SENSITIVITY): Troponin I (High Sensitivity): 3 ng/L (ref <18)

## 2024-07-23 MED ORDER — AZITHROMYCIN 250 MG PO TABS: ORAL_TABLET | ORAL | 0 refills | Status: AC

## 2024-07-23 MED ORDER — CEPHALEXIN 500 MG PO CAPS: 500.0000 mg | ORAL_CAPSULE | Freq: Two times a day (BID) | ORAL | 0 refills | Status: DC

## 2024-07-23 MED ORDER — IOHEXOL 350 MG/ML SOLN
100.0000 mL | Freq: Once | INTRAVENOUS | Status: AC | PRN
  Administered 2024-07-23 (×2): 100 mL via INTRAVENOUS

## 2024-07-23 MED ORDER — CEPHALEXIN 500 MG PO CAPS
500.0000 mg | ORAL_CAPSULE | Freq: Once | ORAL | Status: AC
  Administered 2024-07-23 (×2): 500 mg via ORAL
  Filled 2024-07-23: qty 1

## 2024-07-23 MED ORDER — AZITHROMYCIN 500 MG PO TABS
500.0000 mg | ORAL_TABLET | Freq: Once | ORAL | Status: AC
  Administered 2024-07-23 (×2): 500 mg via ORAL
  Filled 2024-07-23: qty 1

## 2024-07-23 MED ORDER — ACETAMINOPHEN 500 MG PO TABS
1000.0000 mg | ORAL_TABLET | Freq: Once | ORAL | Status: AC
  Administered 2024-07-23 (×2): 1000 mg via ORAL
  Filled 2024-07-23: qty 2

## 2024-07-23 NOTE — ED Provider Notes (Signed)
 [facility] Provider Note    Event Date/Time   First MD Initiated Contact with Patient 07/23/24 1801     (approximate)  History   Chief Complaint: Palpitations  HPI  Jody Alvarado is a 23 y.o. female with no significant past medical history who presents to the emergency department for 1 week of cough congestion now with tachycardia.  According to the patient for the past week she has been congested with cough and shortness of breath.  She saw her doctor today but was sent to the emergency department given the fast heart rate of 120's.  Here the patient continues to be around 122 noted to be febrile to 100.4 as well, unknown to patient.  She has not taken any Tylenol  or ibuprofen.  Denies any chest pain although does state some shortness of breath and tightness sensation.  Physical Exam   Triage Vital Signs: ED Triage Vitals  Encounter Vitals Group     BP 07/23/24 1517 (!) 165/105     Girls Systolic BP Percentile --      Girls Diastolic BP Percentile --      Boys Systolic BP Percentile --      Boys Diastolic BP Percentile --      Pulse Rate 07/23/24 1517 (!) 122     Resp 07/23/24 1517 (!) 22     Temp 07/23/24 1517 (!) 100.4 F (38 C)     Temp Source 07/23/24 1517 Oral     SpO2 07/23/24 1517 100 %     Weight --      Height --      Head Circumference --      Peak Flow --      Pain Score 07/23/24 1518 8     Pain Loc --      Pain Education --      Exclude from Growth Chart --     Most recent vital signs: Vitals:   07/23/24 1750 07/23/24 1805  BP:  138/87  Pulse:  (!) 129  Resp:  (!) 22  Temp:    SpO2: 97% 96%    General: Awake, no distress.  CV:  Good peripheral perfusion.  Regular rhythm rate around 120 Resp:  Normal effort.  Equal breath sounds bilaterally.  Abd:  No distention.  Soft, nontender.  No rebound or guarding.   ED Results / Procedures / Treatments   EKG  EKG viewed and interpreted by myself shows sinus tachycardia at 124  bpm with a narrow QRS, normal axis, normal intervals, nonspecific ST changes without ST elevation.  RADIOLOGY  I have reviewed interpret the chest x-ray images.  No consolidation on my evaluation. Radiology is read the x-ray is negative   MEDICATIONS ORDERED IN ED: Medications  acetaminophen  (TYLENOL ) tablet 1,000 mg (has no administration in time range)     IMPRESSION / MDM / ASSESSMENT AND PLAN / ED COURSE  I reviewed the triage vital signs and the nursing notes.  Patient's presentation is most consistent with acute presentation with potential threat to life or bodily function.  Patient presents the emergency department for 1 week of cough congestion as well as elevated heart rate found today at her doctor's office.  Overall the patient appears well, no distress.  Patient remains tachycardic however she is also febrile.  Patient denies any pleuritic pain.  No history of DVT or PE previously.  Patient's workup is reassuring with a normal CBC reassuring chemistry negative troponin normal lactic acid.  Negative respiratory panel.  Chest x-ray is clear.  I discussed the options with the patient, she is somewhat reluctant to proceed with a CT scan.  We will attempt to dose Tylenol  and monitor the patient's heart rate.  The patient's heart rate comes down with Tylenol  I suspect likely viral illness given the cough and congestion, as symptoms have been ongoing for 1 week and now the patient is having low-grade fevers I believe it would be warranted to start the patient on antibiotics such as Zithromax  to cover for any possible bacterial infection or superinfection.  If the patient's heart rate does not come down then the patient may require further imaging such as a CT scan of her chest as a precaution.  Patient agreeable to plan.  Patient's heart rate remains elevated.  I have added on a TSH and we will proceed with a CTA of the chest as a precaution.  Patient agreeable to plan.  Patient's heart  rate is now coming down currently 103 bpm.  Patient's TSH is normal.  I reviewed the CTA images I do not appreciate any obvious PE but the patient does appear to have a right lower lobe opacity suspect likely community-acquired pneumonia.  Will await CT read and anticipate likely discharge home on Keflex  and Zithromax  to cover for community-acquired pneumonia.  FINAL CLINICAL IMPRESSION(S) / ED DIAGNOSES   Cough Congestion Fever Tachycardia  Note:  This document was prepared using Dragon voice recognition software and may include unintentional dictation errors.   Dorothyann Drivers, MD 07/23/24 2244

## 2024-07-23 NOTE — Progress Notes (Signed)
 NEW patient visit  Patient: Jody Alvarado   DOB: January 29, 2001   23 y.o. Female  MRN: [id number] Visit Date: 07/23/2024  Today's healthcare provider: Jolynn Spencer, PA-C   Chief Complaint  Patient presents with   Cough    Cough X last Monday associated with wheezing, body aches and chest aches. She reports she took some mucus relief that she finished yesterday and has only used cough drops today. Mucus previously yellow/green but now clear so that she was feeling better until she woke up with aches. Decribes cough as deep cough   Subjective    Jody Alvarado is a 23 y.o. female who presents today as a new patient to establish care.   Discussed the use of AI scribe software for clinical note transcription with the patient, who gave verbal consent to proceed.  History of Present Illness Jody Alvarado is a 23 year old female who presents with a cough and associated symptoms.  She has experienced a cough since last Monday, initially with yellow-green mucus, now clear, and accompanied by wheezing. She has used mucous relief medication and cough drops. She experiences shortness of breath and occasional wheezing, particularly in the evening, along with chest pain and rapid heart rate.  Muscle aches began at 7 AM and worsened by 8 AM. She does not believe she has a fever but feels muscle aches similar to those with a slight fever.  She works at a childcare facility and is unaware of any recent contact with COVID-19 or flu cases.  She has nasal congestion, a runny nose, and a slight sore throat. She experiences a sensation of muffled sounds, described as 'feeling kind of a bit fluidy.' No ear or eye problems are present. No history of asthma.    Past Medical History:  Diagnosis Date   Allergy    No past surgical history on file. Family Status  Relation Name Status   Mother  (Not Specified)   Mat Uncle  (Not Specified)   MGM  (Not Specified)  No partnership data on file   Family History   Problem Relation Age of Onset   Depression Mother    Anxiety disorder Mother    Anxiety disorder Maternal Uncle    Thyroid  disease Maternal Grandmother    Social History   Socioeconomic History   Marital status: Single    Spouse name: Not on file   Number of children: Not on file   Years of education: Not on file   Highest education level: Not on file  Occupational History   Not on file  Tobacco Use   Smoking status: Unknown   Smokeless tobacco: Not on file  Vaping Use   Vaping status: Not on file  Substance and Sexual Activity   Alcohol use: Not Currently   Drug use: Not Currently   Sexual activity: Not Currently  Other Topics Concern   Not on file  Social History Narrative   Not on file   Social Drivers of Health   Financial Resource Strain: Not on file  Food Insecurity: Not on file  Transportation Needs: Not on file  Physical Activity: Not on file  Stress: Not on file  Social Connections: Not on file   Outpatient Medications Prior to Visit  Medication Sig   albuterol  (VENTOLIN  HFA) 108 (90 Base) MCG/ACT inhaler Inhale 2 puffs into the lungs every 6 (six) hours as needed for wheezing or shortness of breath.   butalbital -acetaminophen -caffeine  (FIORICET) 50-325-40 MG tablet Take 1 tablet  by mouth every 6 (six) hours as needed for headache.   metFORMIN  (GLUCOPHAGE -XR) 500 MG 24 hr tablet Take 1 tablet (500 mg total) by mouth 2 (two) times daily with a meal. Take 1 tablet daily for one week, take 1 tablets twice daily   buPROPion  (WELLBUTRIN  SR) 150 MG 12 hr tablet 150 mg orally once daily for 3 days, then increase to 300 mg/day (Patient not taking: Reported on 07/23/2024)   naltrexone  (DEPADE) 50 MG tablet Take 0.5-1 tablets (25-50 mg total) by mouth daily. (Patient not taking: Reported on 07/23/2024)   topiramate  (TOPAMAX ) 25 MG capsule Week 1: Take 1 tablet daily.  Week 2: Take 2 tablets daily.  Week 3: Take 3 tablets daily.  Week 4: Take 4 tablets daily.  If unable to  tolerate higher dose, stay at maximum tolerated dose. (Patient not taking: Reported on 07/23/2024)   No facility-administered medications prior to visit.   Allergies  Allergen Reactions   Amoxicillin-Pot Clavulanate Rash, Hives and Other (See Comments)   Amoxicillin Other (See Comments)     There is no immunization history on file for this patient.  Health Maintenance  Topic Date Due   HPV VACCINES (1 - 3-dose series) Never done   HIV Screening  Never done   Meningococcal B Vaccine (1 of 2 - Standard) Never done   Hepatitis C Screening  Never done   DTaP/Tdap/Td (1 - Tdap) Never done   Hepatitis B Vaccines (1 of 3 - 19+ 3-dose series) Never done   INFLUENZA VACCINE  07/13/2024   COVID-19 Vaccine (1 - 2024-25 season) 03/27/2025 (Originally 08/14/2023)    Patient Care Team: Ziglar, Susan K, MD as PCP - General (Family Medicine)  Review of Systems  All other systems reviewed and are negative.  Except see HPI       Objective    BP (!) 149/83 (BP Location: Left Arm, Patient Position: Sitting, Cuff Size: Normal)   Pulse (!) 131   Temp 98.6 F (37 C) (Oral)   Ht 5' 4 (1.626 m)   Wt (!) 354 lb 14.4 oz (161 kg)   SpO2 98%   BMI 60.92 kg/m     Physical Exam Vitals reviewed.  Constitutional:      General: She is not in acute distress.    Appearance: Normal appearance. She is well-developed and normal weight. She is not diaphoretic.  HENT:     Head: Normocephalic and atraumatic.     Right Ear: Ear canal and external ear normal.     Left Ear: Ear canal and external ear normal.     Nose: Congestion and rhinorrhea present.     Mouth/Throat:     Pharynx: Posterior oropharyngeal erythema present.     Comments: Postnasal drainage noted Eyes:     General: No scleral icterus.       Right eye: No discharge.        Left eye: No discharge.     Extraocular Movements: Extraocular movements intact.     Conjunctiva/sclera: Conjunctivae normal.     Pupils: Pupils are equal,  round, and reactive to light.  Neck:     Thyroid : No thyromegaly.  Cardiovascular:     Rate and Rhythm: Tachycardia present. Rhythm irregular.     Pulses: Normal pulses.     Heart sounds: Normal heart sounds. No murmur heard. Pulmonary:     Effort: Pulmonary effort is normal. No respiratory distress.     Breath sounds: Rhonchi present. No wheezing or rales.  Abdominal:     General: Abdomen is flat. Bowel sounds are normal.     Palpations: Abdomen is soft.  Musculoskeletal:     Cervical back: Neck supple.     Right lower leg: No edema.     Left lower leg: No edema.  Lymphadenopathy:     Cervical: No cervical adenopathy.  Skin:    General: Skin is warm and dry.     Findings: No rash.  Neurological:     Mental Status: She is alert and oriented to person, place, and time. Mental status is at baseline.  Psychiatric:        Mood and Affect: Mood normal.        Behavior: Behavior normal.     Depression Screen    05/30/2024    1:06 PM 03/27/2024   10:03 AM 06/27/2023    9:02 AM 06/03/2023    2:39 PM  PHQ 2/9 Scores  PHQ - 2 Score 0 0 0 0  PHQ- 9 Score 0 1 0 0   No results found for any visits on 07/23/24.  Assessment & Plan      Assessment & Plan Acute cough with wheezing and shortness of breath Nasal congestion and sore throat Myalgia Tachycardia > 130 X 7 days Worsening since yesterday Vitals were unstable with elevated blood pressure and heart rate > 130 with some abnormal lung sounds, R > L, T > 98.6 - Consider COVID-19 and flu testing at home or in clinic based on symptom progression. - Consider Performing chest x-ray to evaluate for underlying pulmonary issues for possibility of bronchitis vs pneumonia - Perform EKG to assess for cardiac involvement.  EKG showed sinus tachycardia with HR of 128  Per chart review, pt has an acute bronchitis with wheezing on 03/27/24 Considering unstable vitals in a patient who is new to me , advised pt to proceed to ED for prompt  evaluation and management.   Morbid obesity (HCC) Chronic and unstable Body mass index is 60.92 kg/m. Currently on Bupropion , naltrexone , topiramate , metformin , per chart review Weight loss of 5% of pt's current weight via healthy diet and daily exercise encouraged. Will follow-up  Encounter to establish care Welcomed to our clinic Reviewed past medical hx, social hx, family hx and surgical hx Pt advised to send all vaccination records or screening   No follow-ups on file.    The patient was advised to call back or seek an in-person evaluation if the symptoms worsen or if the condition fails to improve as anticipated.  I discussed the assessment and treatment plan with the patient. The patient was provided an opportunity to ask questions and all were answered. The patient agreed with the plan and demonstrated an understanding of the instructions.  I, Orson Rho, PA-C have reviewed all documentation for this visit. The documentation on  07/23/2024   for the exam, diagnosis, procedures, and orders are all accurate and complete.  Jolynn Spencer, [facility], MMS [facility] [phone] (phone) [phone] (fax)  [facility]

## 2024-07-23 NOTE — ED Notes (Signed)
 Lab called to draw blood

## 2024-07-23 NOTE — ED Triage Notes (Signed)
 Pt c/o racing heart, body aches, and nasal congestion x1 week. Pt is AOX4, NAD noted.

## 2024-07-23 NOTE — Discharge Instructions (Addendum)
 Please take antibiotics as prescribed for their entire course.  Please use Tylenol  or ibuprofen every 6 hours as needed for fever/discomfort.  Return to the emergency department for any worsening symptoms or any other symptom personally concerning to yourself.  Otherwise please follow-up with your doctor in the next 2 days for recheck/reevaluation.

## 2024-07-24 DIAGNOSIS — R000 Tachycardia, unspecified: Secondary | ICD-10-CM | POA: Insufficient documentation

## 2024-07-24 DIAGNOSIS — R0981 Nasal congestion: Secondary | ICD-10-CM | POA: Insufficient documentation

## 2024-07-24 DIAGNOSIS — R062 Wheezing: Secondary | ICD-10-CM | POA: Insufficient documentation

## 2024-07-24 DIAGNOSIS — R051 Acute cough: Secondary | ICD-10-CM | POA: Insufficient documentation

## 2024-07-24 DIAGNOSIS — R0602 Shortness of breath: Secondary | ICD-10-CM | POA: Insufficient documentation

## 2024-08-02 ENCOUNTER — Other Ambulatory Visit: Payer: Self-pay | Admitting: Family Medicine

## 2024-08-02 DIAGNOSIS — E282 Polycystic ovarian syndrome: Secondary | ICD-10-CM

## 2024-08-14 ENCOUNTER — Encounter: Payer: Self-pay | Admitting: Family Medicine

## 2024-08-15 ENCOUNTER — Encounter: Payer: Self-pay | Admitting: Physician Assistant

## 2024-08-20 ENCOUNTER — Encounter: Payer: Self-pay | Admitting: Family Medicine

## 2024-08-20 ENCOUNTER — Ambulatory Visit: Admitting: Family Medicine

## 2024-08-20 VITALS — BP 133/79 | HR 94 | Ht 64.0 in | Wt 360.0 lb

## 2024-08-20 DIAGNOSIS — J301 Allergic rhinitis due to pollen: Secondary | ICD-10-CM | POA: Diagnosis not present

## 2024-08-20 DIAGNOSIS — R052 Subacute cough: Secondary | ICD-10-CM | POA: Diagnosis not present

## 2024-08-20 MED ORDER — BENZONATATE 100 MG PO CAPS: 100.0000 mg | ORAL_CAPSULE | Freq: Two times a day (BID) | ORAL | 0 refills | Status: AC | PRN

## 2024-08-20 MED ORDER — ALBUTEROL SULFATE HFA 108 (90 BASE) MCG/ACT IN AERS: 2.0000 | INHALATION_SPRAY | Freq: Four times a day (QID) | RESPIRATORY_TRACT | 2 refills | Status: AC | PRN

## 2024-08-20 NOTE — Progress Notes (Signed)
 Established patient visit   Patient: Jody Alvarado   DOB: 2001/04/02   22 y.o. Female  MRN: [id number] Visit Date: 08/20/2024  Today's healthcare provider: LAURAINE LOISE BUOY, DO   Chief Complaint  Patient presents with   Cough    Requesting treatment for cough (I had pneumonia 4 weeks ago and I was on an antibiotic and I took it all but I still have a cough. I've taken musinex and Delsium and it has not helped the cough go away)   Subjective    Cough Pertinent negatives include no chest pain, chills, fever or shortness of breath.   Jody Alvarado is a 23 year old female who presents with a persistent cough following pneumonia treatment.  She completed a course of antibiotics, including azithromycin  for five days and Keflex  for one week, for pneumonia. Despite this treatment, she continues to experience a persistent cough. The cough occurs throughout the day and is sometimes productive, though not consistently. Over-the-counter medications such as Mucinex and Delsym have been tried, with Delsym providing no relief.  Prior to her ER visit, she experienced fevers and a high heart rate. During the ER visit, a CT scan and x-ray were performed. All symptoms resolved with antibiotics except for the cough.  She has a history of receiving inhalers during past illnesses, which helped alleviate her symptoms. She notes occasional wheezing, which is not constant but occurs sporadically. She also reports having allergies, which may contribute to her symptoms, especially with seasonal changes.  She has no history of asthma but has been given inhalers in the past for similar symptoms. She has not used Tessalon  Perles before. No current fevers or chills.       Medications: Outpatient Medications Prior to Visit  Medication Sig   buPROPion  (WELLBUTRIN  SR) 150 MG 12 hr tablet 150 mg orally once daily for 3 days, then increase to 300 mg/day (Patient not taking: Reported on 07/23/2024)    butalbital -acetaminophen -caffeine  (FIORICET) 50-325-40 MG tablet Take 1 tablet by mouth every 6 (six) hours as needed for headache.   metFORMIN  (GLUCOPHAGE -XR) 500 MG 24 hr tablet Take 1 tablet (500 mg total) by mouth 2 (two) times daily with a meal. Take 1 tablet daily for one week, take 1 tablets twice daily   naltrexone  (DEPADE) 50 MG tablet Take 0.5-1 tablets (25-50 mg total) by mouth daily. (Patient not taking: Reported on 07/23/2024)   topiramate  (TOPAMAX ) 25 MG capsule Week 1: Take 1 tablet daily.  Week 2: Take 2 tablets daily.  Week 3: Take 3 tablets daily.  Week 4: Take 4 tablets daily.  If unable to tolerate higher dose, stay at maximum tolerated dose. (Patient not taking: Reported on 07/23/2024)   [DISCONTINUED] albuterol  (VENTOLIN  HFA) 108 (90 Base) MCG/ACT inhaler Inhale 2 puffs into the lungs every 6 (six) hours as needed for wheezing or shortness of breath.   [DISCONTINUED] cephALEXin  (KEFLEX ) 500 MG capsule Take 1 capsule (500 mg total) by mouth 2 (two) times daily.   No facility-administered medications prior to visit.    Review of Systems  Constitutional:  Negative for appetite change, chills, fatigue and fever.  Respiratory:  Positive for cough. Negative for chest tightness and shortness of breath.   Cardiovascular:  Negative for chest pain and palpitations.  Neurological:  Negative for dizziness and weakness.        Objective    BP 133/79 (BP Location: Right Wrist, Patient Position: Sitting, Cuff Size: Normal)   Pulse  94   Ht 5' 4 (1.626 m)   Wt (!) 360 lb (163.3 kg)   LMP 07/20/2024 (Exact Date)   SpO2 100%   BMI 61.79 kg/m     Physical Exam Constitutional:      Appearance: Normal appearance.  HENT:     Head: Normocephalic and atraumatic.  Eyes:     General: No scleral icterus.    Extraocular Movements: Extraocular movements intact.     Conjunctiva/sclera: Conjunctivae normal.  Cardiovascular:     Rate and Rhythm: Normal rate and regular rhythm.      Pulses: Normal pulses.     Heart sounds: Normal heart sounds.  Pulmonary:     Effort: Pulmonary effort is normal. No respiratory distress.     Breath sounds: Normal breath sounds.  Musculoskeletal:     Right lower leg: No edema.     Left lower leg: No edema.  Skin:    General: Skin is warm and dry.  Neurological:     Mental Status: She is alert and oriented to person, place, and time. Mental status is at baseline.  Psychiatric:        Mood and Affect: Mood normal.        Behavior: Behavior normal.      No results found for any visits on 08/20/24.  Assessment & Plan    Subacute cough -     Albuterol  Sulfate HFA; Inhale 2 puffs into the lungs every 6 (six) hours as needed for wheezing or shortness of breath.  Dispense: 8 g; Refill: 2 -     Benzonatate ; Take 1 capsule (100 mg total) by mouth 2 (two) times daily as needed for cough.  Dispense: 20 capsule; Refill: 0  Seasonal allergic rhinitis due to pollen     Subacute cough following recent pneumonia Persistent cough post-pneumonia, possibly due to residual effects or allergic asthma. No fever or chills. Previous antibiotics used. Intermittent wheezing noted. - Prescribed albuterol  inhaler, sent to [facility] in [place]. - Prescribed Tessalon  Perles for cough management.  Allergic rhinitis Symptoms exacerbated by seasonal changes, potential for allergic asthma presenting as persistent cough during these changes. - Monitor for seasonal illness patterns and consider allergic asthma if symptoms persist or worsen.    Return if symptoms worsen or fail to improve.      I discussed the assessment and treatment plan with the patient  The patient was provided an opportunity to ask questions and all were answered. The patient agreed with the plan and demonstrated an understanding of the instructions.   The patient was advised to call back or seek an in-person evaluation if the symptoms worsen or if the condition fails to improve as  anticipated.    LAURAINE LOISE BUOY, DO  [facility] [phone] (phone) [phone] (fax)  [facility]

## 2024-08-26 ENCOUNTER — Encounter: Payer: Self-pay | Admitting: Family Medicine

## 2024-08-31 ENCOUNTER — Ambulatory Visit: Admitting: Family Medicine
